# Patient Record
Sex: Female | Born: 1965 | Race: White | Hispanic: No | Marital: Married | Smoking: Never smoker
Health system: Southern US, Community
[De-identification: ages and names within clinical notes are randomized; demographics above are authoritative.]

## PROBLEM LIST (undated history)

## (undated) DIAGNOSIS — IMO0001 Reserved for inherently not codable concepts without codable children: Secondary | ICD-10-CM

## (undated) DIAGNOSIS — E119 Type 2 diabetes mellitus without complications: Principal | ICD-10-CM

## (undated) DIAGNOSIS — F32A Depression, unspecified: Secondary | ICD-10-CM

## (undated) DIAGNOSIS — Z139 Encounter for screening, unspecified: Secondary | ICD-10-CM

## (undated) DIAGNOSIS — Z1231 Encounter for screening mammogram for malignant neoplasm of breast: Secondary | ICD-10-CM

## (undated) DIAGNOSIS — Z9889 Other specified postprocedural states: Secondary | ICD-10-CM

## (undated) DIAGNOSIS — D649 Anemia, unspecified: Secondary | ICD-10-CM

## (undated) DIAGNOSIS — E079 Disorder of thyroid, unspecified: Secondary | ICD-10-CM

## (undated) DIAGNOSIS — F319 Bipolar disorder, unspecified: Secondary | ICD-10-CM

## (undated) DIAGNOSIS — R112 Nausea with vomiting, unspecified: Secondary | ICD-10-CM

## (undated) DIAGNOSIS — T8859XA Other complications of anesthesia, initial encounter: Secondary | ICD-10-CM

## (undated) DIAGNOSIS — T4145XA Adverse effect of unspecified anesthetic, initial encounter: Secondary | ICD-10-CM

## (undated) DIAGNOSIS — M1990 Unspecified osteoarthritis, unspecified site: Secondary | ICD-10-CM

## (undated) DIAGNOSIS — G43909 Migraine, unspecified, not intractable, without status migrainosus: Secondary | ICD-10-CM

## (undated) DIAGNOSIS — F329 Major depressive disorder, single episode, unspecified: Secondary | ICD-10-CM

## (undated) DIAGNOSIS — E7800 Pure hypercholesterolemia, unspecified: Secondary | ICD-10-CM

## (undated) DIAGNOSIS — E559 Vitamin D deficiency, unspecified: Secondary | ICD-10-CM

## (undated) HISTORY — PX: LAPAROSCOPIC CHOLECYSTECTOMY: SUR755

## (undated) HISTORY — PX: TONSILLECTOMY AND ADENOIDECTOMY: SUR1326

## (undated) HISTORY — PX: DILATION AND CURETTAGE OF UTERUS: SHX78

## (undated) HISTORY — PX: CYST EXCISION: SHX5701

## (undated) HISTORY — PX: ANKLE HARDWARE REMOVAL: SHX1149

## (undated) HISTORY — PX: TIBIA FRACTURE SURGERY: SHX806

## (undated) HISTORY — PX: ANKLE FRACTURE SURGERY: SHX122

## (undated) HISTORY — PX: TIBIA HARDWARE REMOVAL: SUR1133

## (undated) HISTORY — PX: BREAST BIOPSY: SHX20

## (undated) HISTORY — PX: FRACTURE SURGERY: SHX138

---

## 1995-12-20 HISTORY — PX: TUBAL LIGATION: SHX77

## 2010-07-23 ENCOUNTER — Encounter

## 2010-07-26 NOTE — Progress Notes (Signed)
Subjective:      Patient ID: Holly Bradford is a 44 y.o. female.    HPI:  Physical for [facility].  Currently not taking any meds.  Discontinued meds 7-8 mos ago.  Not checking sugars.  No polydipsia or polyuria.  Weight stable.  Very busy lately, stress with school and work.  Lost brother-in-law to suicide, son starting "cutting".  No recent labs.    Pt thinks she's peri-menopausal.  Periods still regular.  Very fatigue and having a hard time with sex.  No recent gyn visit for well female.  No vaginal discharge or odor.  No urinary symptoms.    Difficulty with concentration off the Lexapro.  Does not want to go back on.  Afraid of weight gain.    Review of Systems   Constitutional: Negative.  Negative for appetite change.   HENT: Negative.    Respiratory: Negative.    Cardiovascular: Negative.    Gastrointestinal: Negative.    Genitourinary: Positive for vaginal pain. Negative for vaginal bleeding, vaginal discharge and pelvic pain.   Psychiatric/Behavioral: Positive for decreased concentration. Negative for suicidal ideas and sleep disturbance. The patient is not nervous/anxious.        Objective:   Physical Exam   Nursing note and vitals reviewed.  Constitutional: She appears well-developed and well-nourished.   HENT:   Head: Normocephalic.   Mouth/Throat: Oropharynx is clear and moist.   Neck: Normal range of motion. Neck supple.   Cardiovascular: Normal rate, regular rhythm and normal heart sounds.    Pulmonary/Chest: Effort normal and breath sounds normal.   Abdominal: Soft. Bowel sounds are normal.   Neurological: She is alert.   Skin: Skin is warm.   Psychiatric: She has a normal mood and affect.       Assessment:    1.  Well adult   2.  Depression   3.  DM2 - uncontrolled (A1C 9.3)    Patient Active Problem List   Diagnoses   ??? DM type 2 (diabetes mellitus, type 2)   ??? Hyperlipemia   ??? Elev transaminase/LDH   ??? Anxiety   ??? Vitamin D deficiency   ??? Medical non-compliance         Plan:    Rx ActoPlusMet 15/500  mg 1 po bid    Rx Wellbutrin XL 150 mg 1 po daily   Recommend periodic sugar checks   Pt needs well female exam - recommend Scott Stallkamp   Rx vit D 50,000 units q week    Re-check labs in 3 mos    RTO 3 mos.

## 2010-07-27 ENCOUNTER — Encounter

## 2010-07-27 NOTE — Progress Notes (Signed)
Addended by: Altamease Oiler on: 07/27/2010 03:35 PM     Modules accepted: Orders, Medications

## 2010-08-09 ENCOUNTER — Telehealth

## 2010-08-09 NOTE — Telephone Encounter (Signed)
Pt called she started Wellbutrin x 1 week ago.  She is having a lot of bad anxiety feels like joints constricting and very anxious.  Pt wanted to know if you would want to change medication or add something for her anxiety.  Informed pt we would call her back.

## 2010-08-09 NOTE — Telephone Encounter (Signed)
Notify patient to stop Wellbutrin.  Will call in order for Zoloft to her pharmacy.  (was e-prescribed)

## 2010-08-09 NOTE — Telephone Encounter (Signed)
Pt notified of med change from Wellbutrin to Zoloft

## 2010-12-30 ENCOUNTER — Encounter

## 2010-12-30 MED ORDER — VITAMIN D (ERGOCALCIFEROL) 1.25 MG (50000 UT) PO CAPS
1.25 MG (50000 UT) | ORAL_CAPSULE | ORAL | Status: DC
Start: 2010-12-30 — End: 2012-05-06

## 2010-12-30 MED ORDER — PIOGLITAZONE HCL-METFORMIN HCL 15-500 MG PO TABS
15-500 MG | ORAL_TABLET | Freq: Two times a day (BID) | ORAL | Status: DC
Start: 2010-12-30 — End: 2011-07-12

## 2010-12-30 MED ORDER — SERTRALINE HCL 50 MG PO TABS
50 MG | ORAL_TABLET | Freq: Every day | ORAL | Status: DC
Start: 2010-12-30 — End: 2011-07-12

## 2010-12-30 NOTE — Telephone Encounter (Signed)
Ins changed  Pt needs 90-day refill for Sertraline 50mg - 1 daily and ActoplusMet 15/50 takes 1 BID and Vit D 50,000 units takes 1 wkly  Please send scripts to [facility] on [facility]

## 2011-06-27 ENCOUNTER — Telehealth

## 2011-06-27 NOTE — Telephone Encounter (Signed)
Pt has an apt on 06-12-11 and would like to know if lab work needs done before the appt.  If so please mail the lab work to the pt.  Call pt with decision.

## 2011-06-27 NOTE — Telephone Encounter (Signed)
Order mailed to pt

## 2011-07-08 LAB — LIPID PANEL
Chol/HDL Ratio: 3.8
Cholesterol, Total: 241
HDL: 63 mg/dL (ref 35–70)
LDL Calculated: 160 mg/dL (ref 0–160)
Triglycerides: 90 mg/dL
VLDL: 18 mg/dL

## 2011-07-08 LAB — HEMOGLOBIN A1C: Hemoglobin A1C: 5.9 %

## 2011-07-12 MED ORDER — SERTRALINE HCL 50 MG PO TABS
50 MG | ORAL_TABLET | Freq: Every day | ORAL | Status: DC
Start: 2011-07-12 — End: 2012-07-23

## 2011-07-12 MED ORDER — PIOGLITAZONE HCL-METFORMIN HCL 15-500 MG PO TABS
15-500 MG | ORAL_TABLET | Freq: Two times a day (BID) | ORAL | Status: DC
Start: 2011-07-12 — End: 2014-04-01

## 2011-07-12 MED ORDER — ATORVASTATIN CALCIUM 40 MG PO TABS
40 MG | ORAL_TABLET | Freq: Every day | ORAL | Status: DC
Start: 2011-07-12 — End: 2011-08-24

## 2011-07-12 MED ADMIN — methylPREDNISolone acetate (DEPO-MEDROL) injection 80 mg: INTRA_ARTICULAR | @ 12:00:00 | NDC 00009347503

## 2011-07-12 NOTE — Progress Notes (Signed)
Dr Annie Sable gave injection to right knee   Depo Medrol 80 mg/ml along with Marcaine 1ml  Ice applied and pt tolerated well

## 2011-07-12 NOTE — Progress Notes (Signed)
Subjective:      Patient ID: Holly Bradford is a 45 y.o. female.    HPI:  Annual eval.  Overall doing pretty good.  Compliant with meds.  Checks sugars occasionally and they are around 120.    Zoloft seems to be doing ok, still has a little anxiety.    Major concern today is her right knee.  Pain in the knee about every day.  Worse at night.  Ibuprofen seems to help.  No locking or giving out.  Located in the middle of the knee.  Does have some swelling.    Patient Active Problem List   Diagnoses   ??? DM type 2 (diabetes mellitus, type 2)   ??? Hyperlipemia   ??? Elev transaminase/LDH   ??? Anxiety   ??? Vitamin D deficiency   ??? Medical non-compliance         Review of Systems   Constitutional: Negative.    HENT: Negative.    Respiratory: Negative.    Cardiovascular: Negative.    Gastrointestinal: Negative.    Musculoskeletal: Positive for arthralgias (right knee pain) and gait problem.   Psychiatric/Behavioral: The patient is nervous/anxious.    All other systems reviewed and are negative.        Objective:   Physical Exam   Nursing note and vitals reviewed.  Constitutional: She is oriented to person, place, and time. She appears well-developed and well-nourished.   HENT:   Head: Normocephalic and atraumatic.   Right Ear: Tympanic membrane normal.   Left Ear: Tympanic membrane normal.   Mouth/Throat: Oropharynx is clear and moist and mucous membranes are normal.   Neck: Carotid bruit is not present.   Cardiovascular: Normal rate, regular rhythm and normal heart sounds.    No murmur heard.  Pulmonary/Chest: Effort normal and breath sounds normal.   Abdominal: Soft. Bowel sounds are normal.   Musculoskeletal: She exhibits no edema.        Right knee: She exhibits decreased range of motion, effusion and abnormal meniscus. tenderness found. Medial joint line tenderness noted.        Legs:  Neurological: She is alert and oriented to person, place, and time.   Skin: Skin is warm and dry.   Psychiatric: She has a normal mood  and affect. Her behavior is normal.       Assessment:      1. DM type 2 (diabetes mellitus, type 2)  pioglitazone-metformin (ACTOPLUS MET) 15-500 MG per tablet   2. Hyperlipemia  atorvastatin (LIPITOR) 40 MG tablet, Lipid panel, AST, ALT   3. Anxiety  sertraline (ZOLOFT) 50 MG tablet   4. Right knee pain  methylPREDNISolone acetate (DEPO-MEDROL) injection 80 mg, X-ray knee right AP and lateral, PR DRAIN/INJECT LARGE JOINT/BURSA           Plan:      -  Chronic medical problems stable  -  Continue current meds  -  Start Lipitor 40 mg qhs  -  Recheck labs in 3 mos  -  After patient consent obtained, the right infrapatellar region of the knee was cleansed with betadine and anesthetized with ethyl chloride.   The knee was then injected with DepoMedrol 80mg  mixed with 4cc of Marcaine.  Ice then applied for 10 minutes, pt tolerated procedure well  -  Check x-ray right knee, will call with results  -  Call office if no improvement with the shot and will MRI vs PT vs Ortho  -  RTO 3 mos

## 2011-07-12 NOTE — Progress Notes (Signed)
Have you seen any other physician or provider since your last visit no    Have you had any other diagnostic tests since your last visit? no    Have you changed or stopped any medications since your last visit? yes - see updated med list

## 2011-07-12 NOTE — Patient Instructions (Addendum)
Thank you for enrolling in MyChart. Please follow the instructions below to securely access your online medical record. MyChart allows you to send messages to your doctor, view your test results, renew your prescriptions, schedule appointments, and more.     How Do I Sign Up?  1. In your Internet browser, go to [email or web address]  2. Click on the Sign Up Now link in the Sign In box. You will see the New Member Sign Up page.  3. Enter your MyChart Access Code exactly as it appears below. You will not need to use this code after you???ve completed the sign-up process. If you do not sign up before the expiration date, you must request a new code.  MyChart Access Code: [phone]  Expires: 09/10/2011  8:00 AM    4. Enter your Social Security Number ([id number]) and Date of Birth (mm/dd/yyyy) as indicated and click Submit. You will be taken to the next sign-up page.  5. Create a MyChart ID. This will be your MyChart login ID and cannot be changed, so think of one that is secure and easy to remember.  6. Create a MyChart password. You can change your password at any time.  7. Enter your Password Reset Question and Answer. This can be used at a later time if you forget your password.   8. Enter your e-mail address. You will receive e-mail notification when new information is available in MyChart.  9. Click Sign Up. You can now view your medical record.     Additional Information  If you have questions, please contact your physician practice where you receive care. Remember, MyChart is NOT to be used for urgent needs. For medical emergencies, dial 911.          Hyperlipidemia   (Dyslipidemia; High Triglycerides; Triglycerides, High)     Definition   Hyperlipidemia is a high level of fats in the blood. These fats, called lipids, include cholesterol and triglycerides. There are five types of hyperlipidemia. The type depends on which lipid in the blood is high.   Causes   Causes may include:   ?? A family  history of hyperlipidemia   ?? A diet high in total fat, saturated fat, or cholesterol   ?? Obesity   ?? Excess alcohol intake   ?? Certain conditions, including:   ?? Diabetes   ?? Low thyroid   ?? Kidney problems   ?? Liver disease   ?? Cushing's syndrome   ?? Certain drugs, such as:   ?? Hormones or birth control pills   ?? Beta-blockers   ?? Some diuretics   ?? Cortisone drugs   ?? Isotretinoin (for acne )   ?? Some anti- HIV drugs   Risk Factors   These factors increase your chance of developing this condition. Tell your doctor if you have any of these:   ?? Advancing age   ?? Sex: female   ?? Postmenopause   ?? Lack of exercise   ?? Smoking   ?? Stress   ?? Overuse of alcohol   Symptoms   Hyperlipidemia usually does not cause symptoms. Very high levels of lipids or triglycerides can cause:   ?? Fat deposits in the skin or tendons ( xanthomas )   ?? Pain, enlargement, or swelling of organs such as the liver, spleen, or pancreas ( pancreatitis )   ?? Obstruction of blood vessels in heart and brain   Hyperlipidemia can increase your risk of atherosclerosis . This is a dangerous hardening of  the arteries. It can end up blocking blood flow. In some cases, this may result in:   ?? Angina   ?? Heart attack   ?? Stroke   ?? Other serious complications   Blood Vessel with Atherosclerosis        2011 [employer]   Diagnosis   This condition is diagnosed with blood tests. These tests measure the levels of lipids in the blood. The National Cholesterol Education Program advises that you have your lipids checked at least once every five years, starting at age 54. Also, the American Academy of Pediatrics recommends lipid screening for children at risk (eg, a family history of hyperlipidemia).   Testing may consist of a fasting blood test for:   ?? Total cholesterol   ?? LDL (bad cholesterol)   ?? HDL (good cholesterol)   ?? Triglycerides   Your doctor may recommend more frequent or earlier testing if you have:   ?? Family history of  hyperlipidemia   ?? Risk factor or disease that may cause hyperlipidemia   ?? Complication that may result from hyperlipidemia   Treatment   Treatment is not only aimed at correcting your cholesterol levels, but also at lowering your overall risk for heart disease and strokes.   Diet Changes   ?? Eat a diet low in total fat, saturated fat, and cholesterol .   ?? Reduce or eliminate the amount of alcohol you drink.   ?? Eat more high-fiber foods.   Lifestyle Changes   ?? If you are overweight, lose weight .   ?? If you smoke, quit .   ?? Exercise regularly . Talk to you doctor before starting an exercise program. You may already have hardening of the arteries or heart disease. These conditions increase your risk of having a heart attack while exercising.   ?? Make sure other medical conditions such as high blood pressure and diabetes are being treated and controlled.   Medications   There are a number of drugs available, such as statins , to treat this condition and help lower your risk for heart disease. Talk to your doctor. Statins have been shown to reduce mortality (death), heart attacks , and stroke.   These medicines are best used as additions to diet and exercise and should not replace healthy lifestyle changes.   Prevention   To help reduce your chance of getting hyperlipidemia, take the following steps:   ?? Starting at age 65, get cholesterol tests.   ?? Eat a diet low in total fat, saturated fat, and cholesterol.   ?? If you smoke, quit.   ?? Drink alcohol in moderation (two drinks per day for men, one drink per day for women).   ?? If you are overweight, lose weight.   ?? Exercise regularly. Talk with your doctor first.   ?? If you have diabetes , control your blood sugar.   ?? Talk to your doctor about medications you are taking. They may have side effects that cause hyperlipidemia.     Last Reviewed: September 2010 Caro Laroche, DO   Updated: 10/27/2009 Type 2 Diabetes   (Diabetes Mellitus Type 2; Insulin-Resistant  Diabetes; Diabetes, Type 2)     Definition   Glucose comes from the breakdown of food. It is the body's energy source. It can be absorbed from the blood into the cells with the help of a hormone called insulin. Without insulin, glucose will build up in the blood and cause hyperglycemia. At the  same time, your body's cells are starved for glucose (energy).   A lack of insulin or resistance to insulin causes diabetes. In type 2 diabetes, the body is resistant to high levels of insulin. There is plenty of insulin in the body, but the cells are unable to use it.   High blood sugar levels over a long period of time can damage vital organs. This can include the kidneys, eyes, and nerves.     The Pancreas        2011 [employer]   Causes   Two conditions contribute to hyperglycemia in type 2 diabetes:   ?? Insulin resistance related to excess body fat   ?? Body's failure to make an adequate amount of insulin   Risk Factors   Factors that increase your chance for type 2 diabetes include:   ?? Having a family history of type 2 diabetes   ?? Being obese or overweight (especially excess weight in the upper body and abdomen)   ?? Eating a lot of meat, especially processed meat (eg, processed luncheon meats, hot dogs, sausages)   ?? Having cholesterol problems (low HDL "good" cholesterol and high triglycerides )   ?? Having high blood pressure   ?? Having a history of cardiovascular disease   ?? Having depression   ?? Having a history of gestational diabetes or having a baby that weighs over nine pounds   ?? Having an endocrine disorder ( Cushing's syndrome , hyperthyroidism , acromegaly , polycystic ovary syndrome , pheochromocytoma, glucagonoma)   ?? Having a condition associated with insulin resistance (eg, acanthosis nigricans )   ?? Having previous blood test results that show impaired glucose tolerance and impaired fasting glucose   ?? Taking certain medicines (eg, pentamidine, nicotinic acid, glucocorticoids, thiazide)    ?? Having a sedentary lifestyle   ?? Having sleep difficulties   ?? Having a low birth weight   ?? Gender: more common in older women than men   ?? Race: [place] American, Hispanic, Native American, Hispanic American, [place] American, or Pacific Islander   ?? Age: 42 years or older and younger people who are obese and belong to at risk ethnic groups   Symptoms   You may not have symptoms for years. Symptoms due to high blood sugar or diabetic complications may include:   ?? Increased urination   ?? Extreme thirst   ?? Hunger   ?? Fatigue   ?? Blurry vision   ?? Irritability   ?? Frequent or recurring infections   ?? Poor wound healing   ?? Angina   ?? Painful leg cramps when walking   ?? Numbness or tingling in the hands or feet   ?? In women: frequent vaginal yeast infections and urinary tract infections   ?? Problems with gums   ?? Itching   ?? Impotence   Diagnosis   The doctor will ask about your symptoms and medical history. You will also be asked about your family history. A physical exam will be done.   Diagnosis is based on the results of blood testing. These guidelines are from the American Diabetes Association (ADA):   ?? Symptoms of diabetes and a random blood test revealing a blood sugar level greater than or equal to 200 mg/dL [16.1 mmol/L]   ?? Blood sugar tests after you have not eaten for eight or more hours (called fasting blood sugar ) revealing blood sugar levels greater than or equal to 126 mg/dL (7.0 mmol/L) on  two different days   ?? Glucose tolerance test measuring blood sugar two hours after you consume glucose with a measurement greater than or equal to 200 mg/dL (09.8 mmol/L)   ?? HbA1c level of 6.5% or higher, indicating poor blood sugar control over the past 2-4 months   mg/dL=milligrams per deciliter of blood; mmol/L=millimole per liter of blood   Treatment   Treatment aims to:   ?? Maintain blood sugar at levels as close to normal as possible   ?? Preventing or delaying complications (regular medical care is  important for this)   ?? Control other conditions that you may have, like high blood pressure and high cholesterol   Diet   ?? Follow a balanced meal plan. Eat consistent and moderate amounts of food at regular times.   1. Nuts and peanut butter are a good choice for a snack. In women with diabetes, these snacks may help reduce the risk of cardiovascular disease.   ?? Do not skip meals.   ?? Eat plenty of vegetables and fiber .   ?? Eat limited amounts of fat .   ?? Eat moderate amounts of protein and low-fat dairy products .   ?? Carefully limit foods containing high concentrated sugar .   ?? Keep a record of your food intake. This will help a dietitian or doctor advise you.   Weight Loss   If you are overweight, talk to your doctor about a reasonable weight goal. Weight loss will help your body respond better to insulin.You and your doctor or nutritionist can develop a safe diet program for you.   There are options that may help you lose weight:   ?? Using a portion control plate BE   ?? Using a prepared meal plan   ?? Eating a Mediterranean-style diet   Group education may help people recently diagnosed with their goals in weight loss.   Exercise   Physical activity:   ?? Can make the body become more sensitive to insulin   ?? Will help you reach and maintain a healthy weight   ?? Can lower the levels of fat in your blood   ?? Has been found to improve blood sugar control Aerobic exercise and strength training can help to improve HbA1c levels. Researchers have also found that long-term strength and endurance training may improve HbAIc, even in the absence of weight loss.   Talk to your doctor about any restrictions. Work with your doctor to make an activity plan. Even a brief counseling session may help to increase your activity levels.   Diabetes is a risk factor for heart disease. Exercising can help to reduce your risk for heart disease.   Oral Medication   Medicines taken by mouth may be used to lower blood sugar:    ?? Metformin: a class of drug that reduces the body's production of glucose. It also makes the body more sensitive to insulin. This combination will help keep blood sugar levels within the normal limits.   ?? Drugs that prompt the cells in the pancreas to make more insulin (eg, sulfonylureas [glyburide , tolazamide], dipeptidyl peptidase-4 inhibitors [ saxagliptin, sitagliptin], repaglinide [Prandin])   1. The FDA has warned that sitagliptin may increase the risk of acute pancreatitis.   ?? Insulin sensitizersa class of drugs that help the body better use insulin (eg, pioglitazone)   ?? Starch blockersa class of drugs (eg, acarbose , miglitol) that lessen glucose absorption into the bloodstream   Injectable medicine, such as:   ??  Incretin-mimetics (eg, exenatide) stimulate the pancreas to produce insulin and suppress appetite often leading to weight loss (twice daily injections).   ?? Amylin analogues (eg, pramlintide) replace a protein that is normally produced by the pancreas and is low in people with type 2 diabetes (injection before each meal).   Talk to your doctor about your drug program.   Insulin   In some cases the body does not make enough insulin. Insulin injections may be needed.   This is needed when blood sugar levels are not kept low enough with lifestyle change and medicine.   Blood Sugar Testing   Checking blood sugar levels during the day can help you stay on track. It will also helps your doctor determine if your treatment is working. Testing is easy with a monitor. Keeping track of blood sugar levels is especially important if you take insulin. Frequency of testing is determined by how well your blood sugar control is doing.   The HbA1c may also be done at your doctor's office. Doctors advise that most keep their HbA1c levels below 7% (ADA recommendation). This level has been shown to lead to fewer diabetic complications.   Regular blood sugar testing may not be needed in patients with type 2  diabetes. It may not be needed for those whose condition is under reasonably good control without insulin. Talk with your doctor before stopping blood sugar monitoring.   Alternative Therapies   One study focused on people with a specific type 2 diabetes. When given vitamin E, they showed a decrease in the rates of heart problems.   Prevention   Lifestyle changes seem to be most effective. To reduce your chances of developing type 2 diabetes:   ?? Participate in regular physical activity.   ?? Maintain a healthy weight.   ?? Drink alcohol in moderation (two drinks per day for a man, and one drink per day for a woman)   ?? Eat a well-balanced diet:   1. Get enough fiber   2. Avoid fatty foods   3. Limit sugar intake   4. Eat more green, leafy vegetables   BE = This therapy has the best evidence available showing that it is effective.     Last Reviewed: September 2010 Bea Laura, MD, [place]   Updated: 02/25/2010 Hyperlipidemia   (Dyslipidemia; High Triglycerides; Triglycerides, High)     Definition   Hyperlipidemia is a high level of fats in the blood. These fats, called lipids, include cholesterol and triglycerides. There are five types of hyperlipidemia. The type depends on which lipid in the blood is high.   Causes   Causes may include:   ?? A family history of hyperlipidemia   ?? A diet high in total fat, saturated fat, or cholesterol   ?? Obesity   ?? Excess alcohol intake   ?? Certain conditions, including:   ?? Diabetes   ?? Low thyroid   ?? Kidney problems   ?? Liver disease   ?? Cushing's syndrome   ?? Certain drugs, such as:   ?? Hormones or birth control pills   ?? Beta-blockers   ?? Some diuretics   ?? Cortisone drugs   ?? Isotretinoin (for acne )   ?? Some anti- HIV drugs   Risk Factors   These factors increase your chance of developing this condition. Tell your doctor if you have any of these:   ?? Advancing age   ?? Sex: female   ?? Postmenopause   ?? Lack of  exercise   ?? Smoking   ?? Stress   ?? Overuse of alcohol   Symptoms    Hyperlipidemia usually does not cause symptoms. Very high levels of lipids or triglycerides can cause:   ?? Fat deposits in the skin or tendons ( xanthomas )   ?? Pain, enlargement, or swelling of organs such as the liver, spleen, or pancreas ( pancreatitis )   ?? Obstruction of blood vessels in heart and brain   Hyperlipidemia can increase your risk of atherosclerosis . This is a dangerous hardening of the arteries. It can end up blocking blood flow. In some cases, this may result in:   ?? Angina   ?? Heart attack   ?? Stroke   ?? Other serious complications   Blood Vessel with Atherosclerosis        2011 [employer]   Diagnosis   This condition is diagnosed with blood tests. These tests measure the levels of lipids in the blood. The National Cholesterol Education Program advises that you have your lipids checked at least once every five years, starting at age 37. Also, the American Academy of Pediatrics recommends lipid screening for children at risk (eg, a family history of hyperlipidemia).   Testing may consist of a fasting blood test for:   ?? Total cholesterol   ?? LDL (bad cholesterol)   ?? HDL (good cholesterol)   ?? Triglycerides   Your doctor may recommend more frequent or earlier testing if you have:   ?? Family history of hyperlipidemia   ?? Risk factor or disease that may cause hyperlipidemia   ?? Complication that may result from hyperlipidemia   Treatment   Treatment is not only aimed at correcting your cholesterol levels, but also at lowering your overall risk for heart disease and strokes.   Diet Changes   ?? Eat a diet low in total fat, saturated fat, and cholesterol .   ?? Reduce or eliminate the amount of alcohol you drink.   ?? Eat more high-fiber foods.   Lifestyle Changes   ?? If you are overweight, lose weight .   ?? If you smoke, quit .   ?? Exercise regularly . Talk to you doctor before starting an exercise program. You may already have hardening of the arteries or heart disease. These  conditions increase your risk of having a heart attack while exercising.   ?? Make sure other medical conditions such as high blood pressure and diabetes are being treated and controlled.   Medications   There are a number of drugs available, such as statins , to treat this condition and help lower your risk for heart disease. Talk to your doctor. Statins have been shown to reduce mortality (death), heart attacks , and stroke.   These medicines are best used as additions to diet and exercise and should not replace healthy lifestyle changes.   Prevention   To help reduce your chance of getting hyperlipidemia, take the following steps:   ?? Starting at age 61, get cholesterol tests.   ?? Eat a diet low in total fat, saturated fat, and cholesterol.   ?? If you smoke, quit.   ?? Drink alcohol in moderation (two drinks per day for men, one drink per day for women).   ?? If you are overweight, lose weight.   ?? Exercise regularly. Talk with your doctor first.   ?? If you have diabetes , control your blood sugar.   ?? Talk to your doctor  about medications you are taking. They may have side effects that cause hyperlipidemia.     Last Reviewed: September 2010 Caro Laroche, DO   Updated: 10/27/2009

## 2011-08-24 MED ORDER — SIMVASTATIN 40 MG PO TABS
40 MG | ORAL_TABLET | Freq: Every evening | ORAL | Status: DC
Start: 2011-08-24 — End: 2012-07-23

## 2011-08-24 NOTE — Telephone Encounter (Signed)
Holly Bradford was notified to stop the Atorvastatin and to take simvastatin that was sent to her pharmacy.

## 2011-08-24 NOTE — Telephone Encounter (Signed)
Holly Bradford states she has horrible problems with constipation while taking Atorvastatin.  She stopped it for a couple weeks and the constipation left.  After restarting it for a week now she is really struggling even though she is taking a laxative/stool softener and is drinking 10 glasses of water.  What do you suggest?

## 2011-09-07 NOTE — Progress Notes (Signed)
I have reviewed the provider's instructions with the patient, answering all questions to her satisfaction.

## 2011-09-07 NOTE — Progress Notes (Signed)
Pt set up with Dr. Coral Else at [facility] on Sept 21, 2012 @ 7:50 am. Pt instructed to bring insurance card, photo id, and medications.  All info faxed to the office.  Spoke with Jasmine December at [facility] she will have Xray disc ready for pt to pick up tomorrow to take to [facility].

## 2011-09-07 NOTE — Progress Notes (Signed)
Subjective:      Patient ID: Holly Bradford is a 45 y.o. female.    HPI:  Pt c/o persistent right knee pain.    Review of Systems   Constitutional: Negative.    HENT: Negative.    Respiratory: Negative.    Cardiovascular: Negative.    Gastrointestinal: Negative.    Musculoskeletal: Positive for arthralgias (right knee pain and swelling).   All other systems reviewed and are negative.        Objective:   Physical Exam   Nursing note and vitals reviewed.  Constitutional: She is oriented to person, place, and time. She appears well-developed and well-nourished.   HENT:   Right Ear: Tympanic membrane normal.   Left Ear: Tympanic membrane normal.   Mouth/Throat: Mucous membranes are normal.   Neck: Carotid bruit is not present.   Cardiovascular: Normal rate, regular rhythm and normal heart sounds.    No murmur heard.  Pulmonary/Chest: Effort normal and breath sounds normal.   Musculoskeletal: She exhibits no edema.        Right knee: She exhibits decreased range of motion, swelling, effusion and abnormal meniscus. tenderness found. Medial joint line tenderness noted.   Neurological: She is alert and oriented to person, place, and time.   Skin: Skin is warm and dry.   Psychiatric: She has a normal mood and affect. Her behavior is normal.       Assessment:      1. Right knee pain  Ambulatory referral to Orthopedic Surgery   2. Baker's cyst of knee  Ambulatory referral to Orthopedic Surgery   3. Knee osteoarthritis             Plan:      -  X-ray reviewed with pt, she appears to have pretty severe OA in medial compartment  -  Will refer to [facility] to see if she is a candidate for arthroscopy vs TKA vs Synvisc  -  RTO prn

## 2011-10-13 LAB — LIPID PANEL
Cholesterol, Total: 197
HDL: 64 mg/dL (ref 35–70)
LDL Calculated: 119 mg/dL (ref 0–160)
Triglycerides: 71 mg/dL
VLDL: 14 mg/dL

## 2012-01-05 MED ORDER — ACTOPLUS MET 15-500 MG PO TABS
15-500 MG | ORAL_TABLET | ORAL | Status: DC
Start: 2012-01-05 — End: 2013-01-24

## 2012-05-07 MED ORDER — VITAMIN D (ERGOCALCIFEROL) 1.25 MG (50000 UT) PO CAPS
1.25 MG (50000 UT) | ORAL_CAPSULE | ORAL | Status: DC
Start: 2012-05-07 — End: 2014-07-30

## 2012-07-03 ENCOUNTER — Telehealth

## 2012-07-03 NOTE — Telephone Encounter (Signed)
Pt called and says she has a physical with you on Aug 5th. She would like some labs done before she comes in. Pt advised to get them done 2 weeks before her appt. Pt will pick them up on thursday

## 2012-07-04 NOTE — Telephone Encounter (Signed)
Order at front window

## 2012-07-13 LAB — COMPREHENSIVE METABOLIC PANEL

## 2012-07-13 LAB — VITAMIN D 25 HYDROXY

## 2012-07-13 LAB — LIPID PANEL
Chol/HDL Ratio: 3.5
Cholesterol, Total: 224
HDL: 64 mg/dL (ref 35–70)
LDL Calculated: 144 mg/dL (ref 0–160)
Triglycerides: 79 mg/dL
VLDL: 16 mg/dL

## 2012-07-13 LAB — HEMOGLOBIN A1C: Hemoglobin A1C: 5.9 %

## 2012-07-16 NOTE — Telephone Encounter (Signed)
Confirmed upcoming appointment and reminded patient to get lab work done.

## 2012-07-23 MED ORDER — PRAVASTATIN SODIUM 40 MG PO TABS
40 MG | ORAL_TABLET | Freq: Every day | ORAL | Status: DC
Start: 2012-07-23 — End: 2012-10-02

## 2012-07-23 MED ORDER — SERTRALINE HCL 50 MG PO TABS
50 MG | ORAL_TABLET | Freq: Every day | ORAL | Status: DC
Start: 2012-07-23 — End: 2014-07-30

## 2012-07-23 NOTE — Patient Instructions (Signed)
I have reviewed the provider's instructions with the patient, answering all questions to her satisfaction.

## 2012-07-23 NOTE — Progress Notes (Signed)
Subjective:      Patient ID: Holly Bradford is a 46 y.o. female.    HPI:  Annual eval.  Doing well overall.      Still struggling with right knee.  Was trying to get Synvisc approved.  Works at [employer], stands the whole time.  Trying to lose wt and find a new job.    Wt Readings from Last 3 Encounters:   07/23/12 299 lb 12.8 oz (135.988 kg)   09/07/11 310 lb (140.615 kg)   07/12/11 302 lb 6.4 oz (137.168 kg)     Not taking cholesterol med, messes up her bowels.      Sugars are doing fine, not checking on regular basis.    Patient Active Problem List   Diagnosis   ??? DM type 2 (diabetes mellitus, type 2)   ??? Hyperlipemia   ??? Elev transaminase/LDH   ??? Anxiety   ??? Vitamin D deficiency   ??? Medical non-compliance           Review of Systems   Constitutional: Negative.    HENT: Negative.    Respiratory: Negative.    Cardiovascular: Negative.    Gastrointestinal: Negative.    Musculoskeletal: Positive for arthralgias and gait problem.   All other systems reviewed and are negative.        Objective:   Physical Exam   Nursing note and vitals reviewed.  Constitutional: She is oriented to person, place, and time. She appears well-developed and well-nourished.   HENT:   Head: Normocephalic and atraumatic.       Right Ear: Tympanic membrane normal.   Left Ear: Tympanic membrane normal.   Mouth/Throat: Oropharynx is clear and moist and mucous membranes are normal.   Neck: Carotid bruit is not present.   Cardiovascular: Normal rate, regular rhythm and normal heart sounds.    No murmur heard.  Pulmonary/Chest: Effort normal and breath sounds normal.   Abdominal: Soft. Bowel sounds are normal.   Musculoskeletal: She exhibits no edema.   Neurological: She is alert and oriented to person, place, and time.   Skin: Skin is warm and dry.   Psychiatric: She has a normal mood and affect. Her behavior is normal.       Assessment:      1. DM type 2 (diabetes mellitus, type 2)  pravastatin (PRAVACHOL) 40 MG tablet   2. Hyperlipemia   Lipid panel, AST, ALT   3. Anxiety  sertraline (ZOLOFT) 50 MG tablet   4. Vitamin D deficiency     5. Change in facial mole  Ambulatory referral to Plastic Surgery            Plan:      -  Chronic medical problems stable  -  Continue current medications  -  Start Pravastatin  -  Refer to Dr. Marcelino Duster  -  Recheck labs 3 mos  -  RTO 3 mos    Holly Bradford received counseling on the following healthy behaviors: nutrition and exercise    Patient given educational materials on Diabetes    I have instructed Holly Bradford to complete a self tracking handout on Blood Sugars  and instructed them to bring it with them to her next appointment.     Discussed use, benefit, and side effects of prescribed medications.  Barriers to medication compliance addressed.  All patient questions answered.  Pt voiced understanding.

## 2012-07-25 NOTE — Progress Notes (Signed)
Appt with Dr. Marcelino Duster 08-09-12 @ 1100, pt advised of appt time

## 2012-08-01 MED ORDER — SERTRALINE HCL 50 MG PO TABS
50 MG | ORAL_TABLET | ORAL | Status: DC
Start: 2012-08-01 — End: 2012-10-02

## 2012-08-01 MED ORDER — ATORVASTATIN CALCIUM 40 MG PO TABS
40 MG | ORAL_TABLET | ORAL | Status: DC
Start: 2012-08-01 — End: 2013-06-28

## 2012-10-02 NOTE — Progress Notes (Signed)
Subjective:      Patient ID: Holly Bradford is a 46 y.o. female.    HPI:  Pt was painting yesterday and fell off a stool.  Twisted right knee and right ankle.  Hit head but no LOC.  No neck pain.      Right knee hurts in the area of the kneecap.  Hurts to bend the knee.  Feels a little unsteady at times.    Pt has appt with me in November, lost her lab order.    Review of Systems   Constitutional: Negative.    HENT: Negative.    Respiratory: Negative.    Cardiovascular: Negative.    Gastrointestinal: Negative.    Musculoskeletal: Positive for joint swelling, arthralgias (right knee pain) and gait problem.   All other systems reviewed and are negative.        Objective:   Physical Exam   Nursing note and vitals reviewed.  Constitutional: She is oriented to person, place, and time. She appears well-developed and well-nourished.   HENT:   Head: Normocephalic and atraumatic.   Right Ear: Tympanic membrane normal.   Left Ear: Tympanic membrane normal.   Mouth/Throat: Oropharynx is clear and moist and mucous membranes are normal.   Neck: Carotid bruit is not present.   Cardiovascular: Normal rate, regular rhythm and normal heart sounds.    No murmur heard.  Pulmonary/Chest: Effort normal and breath sounds normal.   Abdominal: Soft. Bowel sounds are normal.   Musculoskeletal: She exhibits no edema.        Right knee: She exhibits decreased range of motion, effusion and abnormal meniscus. Tenderness found. Medial joint line and lateral joint line tenderness noted.   Neurological: She is alert and oriented to person, place, and time.   Skin: Skin is warm and dry.   Psychiatric: She has a normal mood and affect. Her behavior is normal.       Assessment:      1. Right knee pain     2. Hyperlipemia  Lipid panel, AST, ALT            Plan:      -  Would like to give pt a little more time to see if she can heal on her own  -  Recommend Aleve and ice  -  RTO prn

## 2012-11-22 LAB — LIPID PANEL
Chol/HDL Ratio: 2.7
Cholesterol, Total: 189
HDL: 70 mg/dL (ref 35–70)
LDL Calculated: 107 mg/dL (ref 0–160)
Triglycerides: 62 mg/dL
VLDL: 12 mg/dL

## 2012-11-26 MED ORDER — NYSTATIN-TRIAMCINOLONE 100000-0.1 UNIT/GM-% EX CREA
CUTANEOUS | Status: DC
Start: 2012-11-26 — End: 2014-07-30

## 2012-11-26 NOTE — Progress Notes (Signed)
Subjective:      Patient ID: Holly Bradford is a 46 y.o. female.    HPI:  3 month eval.    Here to review cholesterol labs.  Tolerating the Lipitor.  Having some problems with bladder control, wonders if related.    Has a concern today about some rashes in her groin area.  Believes yeast.  Has tried Nystatin in the past which worked well.    Sugars have been doing fine.  Last A1C 5.9.  Up 11 lbs.    Wt Readings from Last 3 Encounters:   11/26/12 310 lb (140.615 kg)   10/02/12 310 lb (140.615 kg)   07/23/12 299 lb 12.8 oz (135.988 kg)     Pt also has been losing her urine lately.  No urgency or burning.  Just when coughs, sneezes, etc.    Patient Active Problem List   Diagnosis   ??? DM type 2 (diabetes mellitus, type 2)   ??? Hyperlipemia   ??? Elev transaminase/LDH   ??? Anxiety   ??? Vitamin D deficiency   ??? Medical non-compliance         Review of Systems   Constitutional: Negative.    HENT: Negative.    Respiratory: Negative.    Cardiovascular: Negative.    Gastrointestinal: Negative.    Genitourinary:        Stress incontinence   Musculoskeletal: Negative.    All other systems reviewed and are negative.        Objective:   Physical Exam   Nursing note and vitals reviewed.  Constitutional: She is oriented to person, place, and time. She appears well-developed and well-nourished.   HENT:   Head: Normocephalic and atraumatic.   Right Ear: Tympanic membrane normal.   Left Ear: Tympanic membrane normal.   Mouth/Throat: Oropharynx is clear and moist and mucous membranes are normal.   Neck: Carotid bruit is not present.   Cardiovascular: Normal rate, regular rhythm and normal heart sounds.    No murmur heard.  Pulmonary/Chest: Effort normal and breath sounds normal.   Abdominal: Soft. Bowel sounds are normal.   Musculoskeletal: She exhibits no edema.   Neurological: She is alert and oriented to person, place, and time.   Skin: Skin is warm and dry.   Psychiatric: She has a normal mood and affect. Her behavior is normal.        Assessment:      1. DM type 2 (diabetes mellitus, type 2)  Hemoglobin A1c, Comprehensive metabolic panel   2. Hyperlipemia  Lipid panel, Comprehensive metabolic panel   3. Candidal intertrigo  nystatin-triamcinolone (MYCOLOG II) cream   4. Stress incontinence     5. Morbid obesity              Plan:      -  Chronic medical problems stable  -  Continue current medications  -  Labs reviewed with pt, good response to Lipitor  -  rx Mycolog given  -  Pt declined Urology referral for #4  -  Encouraged wt loss  -  Recheck labs 6 mos  -  RTO 6 mos    Holly Bradford received counseling on the following healthy behaviors: nutrition, exercise and medication adherence    Patient given educational materials on Diabetes    I have instructed Holly Bradford to complete a self tracking handout on Blood Sugars  and instructed them to bring it with them to her next appointment.     Discussed use, benefit, and  side effects of prescribed medications.  Barriers to medication compliance addressed.  All patient questions answered.  Pt voiced understanding.

## 2012-11-26 NOTE — Progress Notes (Signed)
Have you seen any other physician or provider since your last visit no    Have you had any other diagnostic tests since your last visit? yes - labs    Have you changed or stopped any medications since your last visit including any over-the-counter medicines, vitamins, or herbal medicines? no     Are you taking all your prescribed medications? Yes    If NO, why?               BP Readings from Last 2 Encounters:   11/26/12 124/84   10/02/12 132/78     Hemoglobin A1C (%)   Date Value   07/13/2012 5.9         LDL Calculated (mg/dL)   Date Value   16/12/958 107               Tobacco use:  Patient  reports that she has never smoked. She has never used smokeless tobacco.    If Smoker - Cessation materials given? NA    Is Daily aspirin on medication list?:  No    Diabetic retinal exam done? No   If yes, document in Health Maintenance.     Monofilament placed on counter? Yes    Shoes and socks removed? Yes    BP taken with correct size cuff? Yes   Repeated if > 130/80 NA     Is patient taking any medications for diabetes    Yes   If yes, see medication list    Is the patient reporting any side effects of diabetic medications?   No    Microalbumin performed if applicable?  No      Is patient taking any over the counter medications    Yes   If yes, see medication list

## 2012-11-26 NOTE — Patient Instructions (Addendum)
Type 2 Diabetes: After Your Visit  Your Care Instructions  Type 2 diabetes is a lifelong disease that develops when the pancreas cannot make enough insulin or when the body's tissues cannot use insulin properly. Insulin is a hormone that helps the body???s cells use sugar (glucose) for energy. It also helps the body store extra sugar in muscle, fat, and liver cells.  Without insulin, the sugar cannot get into the cells to do its work. It stays in the blood instead. This can cause high blood sugar levels. A person has diabetes when the blood sugar stays too high too much of the time. Over time, diabetes can lead to diseases of the heart, blood vessels, nerves, kidneys, and eyes.  You may be able to control your blood sugar by losing weight, eating a healthy diet, and getting daily exercise. You may also have to take pills or insulin shots (or both).  Follow-up care is a key part of your treatment and safety. Be sure to make and go to all appointments, and call your doctor if you are having problems. It???s also a good idea to know your test results and keep a list of the medicines you take.  How can you care for yourself at home?  ?? Keep your blood sugar at a target level (which you set with your doctor).  ?? Eat a good diet that spreads carbohydrate throughout the day. Carbohydrate--the body's main source of fuel--affects blood sugar more than any other nutrient. Carbohydrate is in fruits, vegetables, milk, and yogurt. It also is in breads, cereals, vegetables such as potatoes and corn, and sugary foods such as candy and cakes.  ?? Aim for 30 minutes of exercise on most, preferably all, days of the week. Walking is a good choice. You also may want to do other activities, such as running, swimming, cycling, or playing tennis or team sports.  ?? Take your medicines exactly as prescribed. Call your doctor if you think you are having a problem with your medicine. You will get more details on the specific medicines your  doctor prescribes.  ?? Check your blood sugar as often as your doctor recommends. It is important to keep track of any symptoms you have, such as low blood sugar, and any changes in your activities, diet, or insulin use.  ?? Take a daily low-dose aspirin if your doctor suggests it. Aspirin may lower your risk of heart attack.  ?? Do not smoke. If you need help quitting, talk to your doctor about stop-smoking programs and medicines. These can increase your chances of quitting for good.  ?? Keep your cholesterol and blood pressure at normal levels. You may need to take one or more medicines to reach your goals. Take them exactly as directed. Do not stop or change a medicine without talking to your doctor first.  When should you call for help?  Call 911 anytime you think you may need emergency care. For example, call if:  ?? You passed out (lost consciousness), or you suddenly become very sleepy or confused. (You may have very low blood sugar.)  Call your doctor now or seek immediate medical care if:  ?? Your blood sugar is 300 mg/dL or is higher than the level your doctor has set for you.  ?? You have symptoms of low blood sugar, such as:  ?? Sweating.  ?? Feeling nervous, shaky, and weak.  ?? Extreme hunger and slight nausea.  ?? Dizziness and headache.  ?? Blurred vision.  ??   Confusion.  Watch closely for changes in your health, and be sure to contact your doctor if:  ?? You often have problems controlling your blood sugar.  ?? You have symptoms of long-term diabetes problems, such as:  ?? New vision changes.  ?? New pain, numbness, or tingling in your hands or feet.  ?? Skin problems.    Where can you learn more?    Go to [email or web address] and sign in to your MyChart account. Enter C553 in the Search Health Information box to learn more about ???Type 2 Diabetes: After Your Visit.???    If you do not have an account, please click on the ???Sign Up Now??? link.      ?? 2006-2013 [employer]. Care instructions  adapted under license by [facility] ([facility] This care instruction is for use with your licensed healthcare professional. If you have questions about a medical condition or this instruction, always ask your healthcare professional. [employer] disclaims any warranty or liability for your use of this information.  Content Version: 9.7.130178; Last Revised: July 06, 2010    Patient Self-Management Goal for Chronic Condition  Goal: I will take all medications as prescribed by my doctor, and I will call the office if I am having any medication problems.  Barriers to success: none  Plan for overcoming my barriers: N/A     Confidence: 10/10  Date goal set: 11/26/2012  Date goal attained:     I have given patient the [facility] paperwork to review.  I have reviewed the provider's instructions with the patient, answering all questions to her satisfaction.

## 2012-12-19 HISTORY — PX: MOLE REMOVAL: SHX2046

## 2013-01-24 MED ORDER — PIOGLITAZONE HCL-METFORMIN HCL 15-500 MG PO TABS
15-500 MG | ORAL_TABLET | ORAL | Status: DC
Start: 2013-01-24 — End: 2014-07-30

## 2013-03-11 MED ORDER — DOXYCYCLINE HYCLATE 100 MG PO CAPS
100 MG | ORAL_CAPSULE | Freq: Two times a day (BID) | ORAL | Status: AC
Start: 2013-03-11 — End: 2013-03-21

## 2013-03-11 NOTE — Progress Notes (Signed)
Subjective:      Patient ID: Holly Bradford is a 47 y.o. female.    HPI:  Pt c/o cough, congestion, sinus pressure for the last 10 days.  No fevers.  Started out with "achiness" and tired.  Non-productive cough.  OTC's help some.      Review of Systems   Constitutional: Positive for chills and fatigue. Negative for fever.   HENT: Positive for congestion, sore throat, rhinorrhea and sinus pressure.    Respiratory: Positive for cough. Negative for shortness of breath and wheezing.    Cardiovascular: Negative.    Gastrointestinal: Negative.    Musculoskeletal: Positive for myalgias.   All other systems reviewed and are negative.        Objective:   Physical Exam   Nursing note and vitals reviewed.  Constitutional: She is oriented to person, place, and time. She appears well-developed and well-nourished.   HENT:   Head: Normocephalic and atraumatic.   Right Ear: A middle ear effusion is present.   Left Ear: A middle ear effusion is present.   Nose: Mucosal edema and rhinorrhea present. Right sinus exhibits maxillary sinus tenderness. Left sinus exhibits maxillary sinus tenderness.   Mouth/Throat: Mucous membranes are normal. Posterior oropharyngeal erythema present. No oropharyngeal exudate.   Cardiovascular: Normal rate, regular rhythm and normal heart sounds.    No murmur heard.  Pulmonary/Chest: Effort normal. She has no decreased breath sounds. She has no wheezes. She has rhonchi in the right lower field and the left lower field.   Abdominal: Soft. Bowel sounds are normal.   Musculoskeletal: She exhibits no edema.   Neurological: She is alert and oriented to person, place, and time.   Skin: Skin is warm and dry.   Psychiatric: She has a normal mood and affect. Her behavior is normal.       Assessment:      1. Sinusitis  doxycycline (VIBRAMYCIN) 100 MG capsule   2. Bronchitis              Plan:      -  rx Doxy given, pt still sounds viral with the myalgias and fatigue but illness greater than 10 days   -   Continue OTC treatment  -  RTO if worsening symptoms

## 2013-03-11 NOTE — Progress Notes (Signed)
Have you seen any other physician or provider since your last visit no    Have you had any other diagnostic tests since your last visit? no    Have you changed or stopped any medications since your last visit including any over-the-counter medicines, vitamins, or herbal medicines? no     Are you taking all your prescribed medications? Yes    If NO, why?

## 2013-03-11 NOTE — Patient Instructions (Signed)
I have reviewed the provider's instructions with the patient, answering all questions to her satisfaction.

## 2013-05-21 NOTE — Telephone Encounter (Signed)
Confirmed upcoming appointment and reminded patient to get lab work done.

## 2013-06-21 LAB — LIPID PANEL
Chol/HDL Ratio: 2.5
Cholesterol, Total: 161
HDL: 65 mg/dL (ref 35–70)
LDL Calculated: 72 mg/dL (ref 0–160)
Triglycerides: 122 mg/dL
VLDL: 24 mg/dL

## 2013-06-21 LAB — HEMOGLOBIN A1C: Hemoglobin A1C: 6.3 %

## 2013-06-28 MED ORDER — ATORVASTATIN CALCIUM 40 MG PO TABS
40 MG | ORAL_TABLET | Freq: Every evening | ORAL | Status: DC
Start: 2013-06-28 — End: 2013-06-28

## 2013-06-28 MED ORDER — ATORVASTATIN CALCIUM 40 MG PO TABS
40 MG | ORAL_TABLET | Freq: Every evening | ORAL | Status: DC
Start: 2013-06-28 — End: 2014-07-30

## 2013-06-28 NOTE — Progress Notes (Signed)
Have you seen any other physician or provider since your last visit no  Have you had any other diagnostic tests since your last visit? yes - lab  Have you changed or stopped any medications since your last visit? no

## 2013-06-28 NOTE — Progress Notes (Signed)
Subjective:      Patient ID: Holly Bradford is a 47 y.o. female.    HPI:    Chief Complaint   Patient presents with   ??? 6 Month Follow-Up     med refilled     Checking sugars every so often, avg 110 in the am.    Down 7 lbs in March.  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  Rarely exercises, her knees bother her.  Does use the Wii occasionally.  Wt Readings from Last 3 Encounters:   06/28/13 323 lb 3.2 oz (146.603 kg)   03/11/13 330 lb (149.687 kg)   11/26/12 310 lb (140.615 kg)     BP elevated today.  No home checks.  Repeat 138/84.  BP Readings from Last 3 Encounters:   06/28/13 150/84   03/11/13 132/78   11/26/12 124/84     Patient Active Problem List   Diagnosis   ??? DM type 2 (diabetes mellitus, type 2)   ??? Hyperlipemia   ??? Elev transaminase/LDH   ??? Anxiety   ??? Vitamin D deficiency   ??? Medical non-compliance   ??? Stress incontinence   ??? Candidal intertrigo   ??? Morbid obesity     Past Surgical History   Procedure Laterality Date   ??? Breast surgery  biopsy 11/11   ??? Cholecystectomy  2001   ??? Cesarean section  [phone]   ??? Fracture surgery  1999   ??? Dilation and curettage of uterus  1991,1995       Lab Results   Component Value Date    LABA1C 6.3 06/21/2013     No results found for this basename: eAG       No components found with this basename: CHLPL     Lab Results   Component Value Date    TRIG 122 06/20/2013    TRIG 62 11/22/2012    TRIG 79 07/13/2012     Lab Results   Component Value Date    HDL 65 06/20/2013    HDL 70 59/04/6386    HDL 64 07/13/2012     Lab Results   Component Value Date    LDLCALC 72 06/20/2013    LDLCALC 107 11/22/2012    LDLCALC 144 07/13/2012     No results found for this basename: LABVLDL         Chemistry    No results found for this basename: NA, K, CL, CO2, BUN, CREATININE, GLU    No results found for this basename: CALCIUM, ALKPHOS, AST, ALT, BILITOT            No results found for this basename: TSH, T3TOTAL, T4TOTAL, THYROIDAB       No results found for this basename: WBC,  HGB, HCT, MCV, PLT         Health Maintenance   Topic Date Due   ??? Tetanus Vaccine Adult (11 Years And Up)  10/18/1978   ??? Cervical Cancer Screening  10/18/1988   ??? Breast Cancer Screening  10/23/2011   ??? Flu Vaccine Yearly (Adult)  08/19/2013         There is no immunization history on file for this patient.      Review of Systems   Constitutional: Negative.    HENT: Negative.    Respiratory: Negative.    Cardiovascular: Negative.    Gastrointestinal: Negative.    Musculoskeletal: Negative.    All other systems reviewed and are negative.  Objective:   Physical Exam   Constitutional: She is oriented to person, place, and time. She appears well-developed and well-nourished.   HENT:   Head: Normocephalic and atraumatic.   Right Ear: Tympanic membrane normal.   Left Ear: Tympanic membrane normal.   Mouth/Throat: Oropharynx is clear and moist and mucous membranes are normal.   Neck: Carotid bruit is not present.   Cardiovascular: Normal rate, regular rhythm and normal heart sounds.    No murmur heard.  Pulmonary/Chest: Effort normal and breath sounds normal.   Abdominal: Soft. Bowel sounds are normal.   Musculoskeletal: She exhibits no edema.   Neurological: She is alert and oriented to person, place, and time.   Monofilament neg     Skin: Skin is warm and dry.   Psychiatric: She has a normal mood and affect. Her behavior is normal.   Nursing note and vitals reviewed.      Assessment:      1. DM type 2 (diabetes mellitus, type 2)  Microalbumin, Ur    Hemoglobin A1c   2. Hyperlipemia     3. Morbid obesity     4. Vitamin D deficiency     5. Anxiety     6. Stress incontinence              Plan:      -  Chronic medical problems stable  -  Continue current medications  -  Labs reviewed with pt, look good  -  Encourage wt loss  -  Will recheck A1C in 6 mos  -  RTO 6 mos    Merle received counseling on the following healthy behaviors: medication adherence    Patient given educational materials on Diabetes    I have  instructed [name] to complete a self tracking handout on Blood Sugars  and instructed them to bring it with them to her next appointment.     Discussed use, benefit, and side effects of prescribed medications.  Barriers to medication compliance addressed.  All patient questions answered.  Pt voiced understanding.

## 2013-09-09 MED ORDER — KETOROLAC TROMETHAMINE 10 MG PO TABS
10 MG | ORAL_TABLET | Freq: Four times a day (QID) | ORAL | Status: DC | PRN
Start: 2013-09-09 — End: 2014-07-30

## 2013-09-09 NOTE — Telephone Encounter (Signed)
Pt was in with son today and was told to call if she had no relief with taking motrin for neck/shoulder tightness. Please send something to pharmacy. Pt LM on VM

## 2013-09-10 NOTE — Telephone Encounter (Signed)
Attempted to notify patient left voice mail message

## 2013-09-23 MED ORDER — SERTRALINE HCL 50 MG PO TABS
50 MG | ORAL_TABLET | ORAL | Status: DC
Start: 2013-09-23 — End: 2014-07-05

## 2013-12-23 NOTE — Telephone Encounter (Signed)
Confirmed upcoming appointment and reminded patient to get lab work done.

## 2014-03-27 LAB — ALBUMIN, RANDOM URINE
Creatine, Urine: 224.5 mg/dl
Microalb, Ur: 0.7 mg/dl (ref <1.9)
Microalbumin Creatinine Ratio: 3 ug/mg

## 2014-03-27 LAB — HEMOGLOBIN A1C
AVERAGE GLUCOSE: 141 mg/dL — ABNORMAL HIGH (ref 66–114)
Hemoglobin A1C: 6.7 % — ABNORMAL HIGH (ref 4.2–5.8)

## 2014-04-01 MED ORDER — PIOGLITAZONE HCL-METFORMIN HCL 15-500 MG PO TABS
15-500 MG | ORAL_TABLET | Freq: Two times a day (BID) | ORAL | Status: DC
Start: 2014-04-01 — End: 2014-07-30

## 2014-04-01 NOTE — Patient Instructions (Signed)
Patient Self-Management Goal for Chronic Condition  Goal: I will take all medications as prescribed by my doctor, and I will call the office if I am having any medication problems.  Barriers to success: none  Plan for overcoming my barriers: N/A     Confidence: 10/10  Date goal set: 04/01/2014  Date goal attained:       I have reviewed the provider's instructions with the patient, answering all questions to her satisfaction.    Learning About High Cholesterol  What is high cholesterol?  Cholesterol is a type of fat in your blood. It is needed for many body functions, such as making new cells. Cholesterol is made by your body. It also comes from food you eat.  If you have too much cholesterol, it starts to build up in your arteries. This is called hardening of the arteries, or atherosclerosis. High cholesterol raises your risk of a heart attack and stroke.  There are different types of cholesterol. LDL is the "bad" cholesterol. High LDL can raise your risk for heart disease, heart attack, and stroke. HDL is the "good" cholesterol. High HDL is linked with a lower risk for heart disease, heart attack, and stroke.  Your cholesterol levels help your doctor find out your risk for having a heart attack or stroke.  How can you prevent high cholesterol?  A heart-healthy lifestyle can help you prevent high cholesterol. This lifestyle helps lower your risk for a heart attack and stroke.  ?? Eat heart-healthy foods.  ?? Eat fruits, vegetables, whole grains (like oatmeal), dried beans and peas, nuts and seeds, soy products (like tofu), and fat-free or low-fat dairy products.  ?? Replace butter, margarine, and hydrogenated or partially hydrogenated oils with olive and canola oils. (Canola oil margarine without trans fat is fine.)  ?? Replace red meat with fish, poultry, and soy protein (like tofu).  ?? Limit processed and packaged foods like chips, crackers, and cookies.  ?? Be active. Exercise can improve your cholesterol level. Get  at least 30 minutes of exercise on most days of the week. Walking is a good choice. You also may want to do other activities, such as running, swimming, cycling, or playing tennis or team sports.  ?? Stay at a healthy weight. Lose weight if you need to.  ?? Don't smoke. If you need help quitting, talk to your doctor about stop-smoking programs and medicines. These can increase your chances of quitting for good.  How is high cholesterol treated?  The goal of treatment is to reduce your chances of having a heart attack or stroke. The goal is not to lower your cholesterol numbers only.  ?? You may make lifestyle changes, such as eating healthy foods, not smoking, losing weight, and being more active.  ?? You may have to take medicine.  Follow-up care is a key part of your treatment and safety. Be sure to make and go to all appointments, and call your doctor if you are having problems. It's also a good idea to know your test results and keep a list of the medicines you take.   Where can you learn more?   Go to [email or web address] and sign in to your MyChart account. Enter [id number] in the Search Health Information box to learn more about ???Learning About High Cholesterol.???    If you do not have an account, please click on the ???Sign Up Now??? link.     ?? 2006-2015 Healthwise, Incorporated. Care instructions adapted under license by [facility].  This care instruction is for use with your licensed healthcare professional. If you have questions about a medical condition or this instruction, always ask your healthcare professional. [place] any warranty or liability for your use of this information.  Content Version: 10.4.390249; Current as of: February 27, 2013              Diabetes Foot Care: After Your Visit  Your Care Instructions     When you have diabetes, your feet need extra care and attention. Diabetes can damage the nerve endings and blood vessels in your feet, making you less likely  to notice when your feet are injured. Diabetes also limits your body's ability to fight infection and get blood to areas that need it. If you get a minor foot injury, it could become an ulcer or a serious infection. With good foot care, you can prevent most of these problems.  Caring for your feet can be quick and easy. Most of the care can be done when you are bathing or getting ready for bed.  Follow-up care is a key part of your treatment and safety. Be sure to make and go to all appointments, and call your doctor if you are having problems. It???s also a good idea to know your test results and keep a list of the medicines you take.  How can you care for yourself at home?  ?? Keep your blood sugar close to normal by watching what and how much you eat, monitoring blood sugar, taking medicines if prescribed, and getting regular exercise.  ?? Do not smoke. Smoking affects blood flow and can make foot problems worse. If you need help quitting, talk to your doctor about stop-smoking programs and medicines. These can increase your chances of quitting for good.  ?? Eat a diet that is low in fats. High fat intake can cause fat to build up in your blood vessels and decrease blood flow.  ?? Inspect your feet daily for blisters, cuts, cracks, or sores. If you cannot see well, use a mirror or have someone help you.  ?? Take care of your feet:  ?? Wash your feet every day. Use warm (not hot) water. Check the water temperature with your wrists or other part of your body, not your feet.  ?? Dry your feet well. Pat them dry. Do not rub the skin on your feet too hard. Dry well between your toes. If the skin on your feet stays moist, bacteria or a fungus can grow, which can lead to infection.  ?? Keep your skin soft. Use moisturizing skin cream to keep the skin on your feet soft and prevent calluses and cracks. But do not put the cream between your toes, and stop using any cream that causes a rash.  ?? Clean underneath your toenails  carefully. Do not use a sharp object to clean underneath your toenails. Use the blunt end of a nail file or other rounded tool.  ?? Trim and file your toenails straight across to prevent ingrown toenails. Use a nail clipper, not scissors. Use an emery board to smooth the edges.  ?? Change socks daily. Socks without seams are best, because seams often rub the feet. You can find socks for people with diabetes from specialty catalogs.  ?? Look inside your shoes every day for things like gravel or torn linings, which could cause blisters or sores.  ?? Buy shoes that fit well:  ?? Look for shoes that have plenty of space  around the toes. This helps prevent bunions and blisters.  ?? Try on shoes while wearing the kind of socks you will usually wear with the shoes.  ?? Avoid plastic shoes. They may rub your feet and cause blisters. Good shoes should be made of materials that are flexible and breathable, such as leather or cloth.  ?? Break in new shoes slowly by wearing them for no more than an hour a day for several days. Take extra time to check your feet for red areas, blisters, or other problems after you wear new shoes.  ?? Do not go barefoot. Do not wear sandals, and do not wear shoes with very thin soles. Thin soles are easy to puncture. They also do not protect your feet from hot pavement or cold weather.  ?? Have your doctor check your feet during each visit. If you have a foot problem, see your doctor. Do not try to treat an early foot problem at home. Home remedies or treatments that you can buy without a prescription (such as corn removers) can be harmful.  ?? Always get early treatment for foot problems. A minor irritation can lead to a major problem if not properly cared for early.  When should you call for help?  Call your doctor now or seek immediate medical care if:  ?? You have a foot sore, an ulcer or break in the skin that is not healing after 4 days, bleeding corns or calluses, or an ingrown toenail.  ?? You have  blue or black areas, which can mean bruising or blood flow problems.  ?? You have peeling skin or tiny blisters between your toes or cracking or oozing of the skin.  ?? You have a fever for more than 24 hours and a foot sore.  ?? You have new numbness or tingling in your feet that does not go away after you move your feet or change positions.  ?? You have unexplained or unusual swelling of the foot or ankle.  Watch closely for changes in your health, and be sure to contact your doctor if:  ?? You cannot do proper foot care.   Where can you learn more?   Go to [email or web address] and sign in to your MyChart account. Enter A739 in the [place] box to learn more about ???Diabetes Foot Care: After Your Visit.???    If you do not have an account, please click on the ???Sign Up Now??? link.     ?? 2006-2015 Healthwise, Incorporated. Care instructions adapted under license by [facility]. This care instruction is for use with your licensed healthcare professional. If you have questions about a medical condition or this instruction, always ask your healthcare professional. [place] any warranty or liability for your use of this information.  Content Version: 10.4.390249; Current as of: November 01, 2013

## 2014-04-01 NOTE — Progress Notes (Signed)
Vim and questionnaire given to pt   Pt given Adacel vaccine 0.5 ml IM into left  deltoid    Pt tolerated well and no reaction noted       [facility] Apr [facility] at 13o  Pt given order and informed needs to take to appt

## 2014-04-01 NOTE — Progress Notes (Signed)
Have you seen any other physician or provider since your last visit yes [facility]     Have you had any other diagnostic tests since your last visit? yes - labs    Have you changed or stopped any medications since your last visit including any over-the-counter medicines, vitamins, or herbal medicines? yes - see med list     Are you taking all your prescribed medications? Yes    If NO, why?               BP Readings from Last 2 Encounters:   04/01/14 132/82   06/28/13 138/84     Hemoglobin A1C (%)   Date Value   03/26/2014 6.7*        LDL Calculated (mg/dL)   Date Value   1/6/10967/02/2013 72               Tobacco use:  Patient  reports that she has never smoked. She has never used smokeless tobacco.    If Smoker - Cessation materials given? No    Is Daily aspirin on medication list?:  No    Diabetic retinal exam done? No   If yes, document in Health Maintenance.     Monofilament placed on counter? Yes    Shoes and socks removed? Yes    BP taken with correct size cuff? Yes   Repeated if > 140/90 NA     Is patient taking any medications for diabetes    Yes   If yes, see medication list    Is the patient reporting any side effects of diabetic medications?   No    Microalbumin performed if applicable?  NA      Is patient taking any over the counter medications    Yes   If yes, see medication list      No components found with this basename: CHLPL     Lab Results   Component Value Date    TRIG 122 06/20/2013     Lab Results   Component Value Date    HDL 65 06/20/2013     Lab Results   Component Value Date    LDLCALC 72 06/20/2013     No results found for this basename: LABVLDL       No results found for this basename: ALT, AST, GGT, ALKPHOS, BILITOT           Is patient currently taking any cholesterol medications?     Yes   If yes, see med list as above    Is the patient reporting any side effects of cholesterol medications?   No    Is the patient taking any over the counter medications?    Yes   If yes, see med list as  above    Is the patient taking a daily aspirin?    No

## 2014-04-01 NOTE — Progress Notes (Signed)
Subjective:      Patient ID: Holly Bradford is a 48 y.o. female.    HPI:    Chief Complaint   Patient presents with   ??? 6 Month Follow-Up     DM , Lipids    ??? Discuss Labs     6 month eval.  Doing well.    Was recently seen in [facility] ED for CP.  Work up was negative.  Was moved to [facility] for testing.  Had stress and echo per pt that were negative.  Attributes to esophageal spasm after eating a "swedish fish".    Sugars well controlled.  Under a lot of stress.  Not checking on regular basis.  Son has been using drugs, cocaine, shrooms, pot.  Had to be de-toxed.  Lab Results   Component Value Date    LABA1C 6.7* 03/26/2014     Drinking more pop and eating poorly.    Wt Readings from Last 3 Encounters:   04/01/14 327 lb 6.4 oz (148.508 kg)   06/28/13 323 lb 3.2 oz (146.603 kg)   03/11/13 330 lb (149.687 kg)     Patient Active Problem List   Diagnosis   ??? DM type 2 (diabetes mellitus, type 2)   ??? Hyperlipemia   ??? Elev transaminase/LDH   ??? Anxiety   ??? Vitamin D deficiency   ??? Medical non-compliance   ??? Stress incontinence   ??? Candidal intertrigo   ??? Morbid obesity     Past Surgical History   Procedure Laterality Date   ??? Breast surgery  biopsy 11/11   ??? Cholecystectomy  2001   ??? Cesarean section  [phone]   ??? Fracture surgery  1999   ??? Dilation and curettage of uterus  1991,1995       Lab Results   Component Value Date    LABA1C 6.7* 03/26/2014     No results found for this basename: eAG       No components found with this basename: CHLPL     Lab Results   Component Value Date    TRIG 122 06/20/2013    TRIG 62 11/22/2012    TRIG 79 07/13/2012     Lab Results   Component Value Date    HDL 65 06/20/2013    HDL 70 11/22/2012    HDL 64 07/13/2012     Lab Results   Component Value Date    LDLCALC 72 06/20/2013    LDLCALC 107 11/22/2012    LDLCALC 144 07/13/2012     No results found for this basename: LABVLDL         Chemistry    No results found for this basename: NA, K, CL, CO2, BUN, CREATININE, GLU    No results  found for this basename: CALCIUM, ALKPHOS, AST, ALT, BILITOT            No results found for this basename: TSH, T3TOTAL, T4TOTAL, THYROIDAB       No results found for this basename: WBC, HGB, HCT, MCV, PLT         Health Maintenance   Topic Date Due   ??? FOOT EXAM  10/18/1977   ??? OPHTHALMOLOGY EXAM  10/18/1977   ??? TETANUS VACCINE ADULT (11 YEARS AND UP)  10/18/1978   ??? CERVICAL CANCER SCREENING  10/18/1988   ??? BREAST CANCER SCREENING  10/23/2011   ??? LIPIDS  06/20/2014   ??? FLU VACCINE YEARLY (ADULT)  08/19/2014   ??? HEMOGLOBIN A1C  09/25/2014   ???  MICROALBUMINURIA  03/27/2015         There is no immunization history on file for this patient.      Review of Systems   Constitutional: Negative.    HENT: Negative.    Respiratory: Negative.    Cardiovascular: Negative.    Gastrointestinal: Negative.    Musculoskeletal: Negative.    All other systems reviewed and are negative.      Objective:   Physical Exam   Constitutional: She is oriented to person, place, and time. She appears well-developed and well-nourished.   HENT:   Head: Normocephalic and atraumatic.   Right Ear: Tympanic membrane normal.   Left Ear: Tympanic membrane normal.   Mouth/Throat: Oropharynx is clear and moist and mucous membranes are normal.   Neck: Carotid bruit is not present.   Cardiovascular: Normal rate, regular rhythm and normal heart sounds.    No murmur heard.  Pulmonary/Chest: Effort normal and breath sounds normal.   Abdominal: Soft. Bowel sounds are normal.   Musculoskeletal: She exhibits no edema.   Neurological: She is alert and oriented to person, place, and time.   Monofilament neg   Skin: Skin is warm and dry.   Psychiatric: She has a normal mood and affect. Her behavior is normal.   Nursing note and vitals reviewed.      Assessment:      1. Atypical chest pain     2. DM type 2 (diabetes mellitus, type 2) (HCC)  pioglitazone-metformin (ACTOPLUS MET) 15-500 MG per tablet    Comprehensive Metabolic Panel    Hemoglobin A1C   3. Hyperlipemia   Lipid Panel    Comprehensive Metabolic Panel   4. Morbid obesity     5. Anxiety     6. Screening for breast cancer  MAM Digital Screen Bilateral   7. Need for Tdap vaccination  Tdap vaccine greater than or equal to 7yo IM            Plan:      -  Chronic medical problems stable  -  Continue current medications  -  A1C jumped a little to 6.7, dietary changes discussed  -  Order for mammogram given  -  Tdap today  -  Recheck labs 6 mos  -  RTO 6 mos    Holly Bradford received counseling on the following healthy behaviors: medication adherence    Patient given educational materials on Diabetes    I have instructed Holly Bradford to complete a self tracking handout on Blood Sugars  and instructed them to bring it with them to her next appointment.     Discussed use, benefit, and side effects of prescribed medications.  Barriers to medication compliance addressed.  All patient questions answered.  Pt voiced understanding.

## 2014-04-02 NOTE — Telephone Encounter (Signed)
Scheduled mammo at [facility] for April 20 at 130  Pt to arrive at 115  Pt informed of appt and prep

## 2014-04-09 ENCOUNTER — Encounter

## 2014-07-07 MED ORDER — SERTRALINE HCL 50 MG PO TABS
50 MG | ORAL_TABLET | ORAL | Status: DC
Start: 2014-07-07 — End: 2015-07-21

## 2014-07-22 LAB — LIPID PANEL
Chol/HDL Ratio: 2.4 (ref <5)
Cholesterol: 151 mg/dl (ref <200)
HDL: 64 mg/dl — ABNORMAL HIGH (ref 40–60)
LDL Calculated: 72 mg/dL (ref <130)
LDL/HDL Ratio: 1.1 (ref <3.5)
Triglycerides: 76 mg/dl (ref <150)
VLDL Cholesterol Calculated: 15 mg/dL (ref <30)

## 2014-07-22 LAB — COMPREHENSIVE METABOLIC PANEL
ALT: 34 IU/L (ref 5–59)
AST: 27 IU/L (ref 10–42)
Albumin: 4 g/dl (ref 3.2–5.3)
Alk Phosphatase: 61 IU/L (ref 35–121)
Anion Gap: 12
BUN: 9 mg/dl — ABNORMAL LOW (ref 10–20)
CO2: 25 mmol/L (ref 21–32)
Calcium: 8.9 mg/dl (ref 8.7–10.8)
Chloride: 102 mmol/L (ref 95–111)
Creatinine: 0.7 mg/dl (ref 0.5–1.3)
EGFR IF NonAfrican American: 90 (ref >60)
Glucose: 167 mg/dl — ABNORMAL HIGH (ref 70–100)
Potassium: 4.1 mmol/L (ref 3.5–5.4)
Sodium: 135 mmol/L (ref 134–147)
Total Bilirubin: 0.9 mg/dl (ref 0.2–1.3)
Total Protein: 6.6 g/dl (ref 5.8–8.0)
eGFR African American: 109 (ref >60)

## 2014-07-22 LAB — HEMOGLOBIN A1C
AVERAGE GLUCOSE: 189 mg/dL — ABNORMAL HIGH (ref 66–114)
Hemoglobin A1C: 8.3 % — ABNORMAL HIGH (ref 4.2–5.8)

## 2014-07-30 MED ORDER — ATORVASTATIN CALCIUM 40 MG PO TABS
40 MG | ORAL_TABLET | Freq: Every evening | ORAL | Status: DC
Start: 2014-07-30 — End: 2015-07-21

## 2014-07-30 MED ORDER — VITAMIN D (ERGOCALCIFEROL) 1.25 MG (50000 UT) PO CAPS
1.25 MG (50000 UT) | ORAL_CAPSULE | ORAL | Status: DC
Start: 2014-07-30 — End: 2015-07-21

## 2014-07-30 MED ORDER — SERTRALINE HCL 50 MG PO TABS
50 MG | ORAL_TABLET | Freq: Every day | ORAL | Status: DC
Start: 2014-07-30 — End: 2014-09-15

## 2014-07-30 MED ORDER — PIOGLITAZONE HCL-METFORMIN HCL 15-500 MG PO TABS
15-500 MG | ORAL_TABLET | ORAL | Status: DC
Start: 2014-07-30 — End: 2015-07-21

## 2014-07-30 NOTE — Progress Notes (Signed)
Have you seen any other physician or provider since your last visit no  Have you had any other diagnostic tests since your last visit? yes - lab  Have you changed or stopped any medications since your last visit? no

## 2014-07-30 NOTE — Progress Notes (Signed)
Subjective:      Patient ID: Holly Bradford is a 48 y.o. female.    HPI:    Chief Complaint   Patient presents with   ??? Annual Exam     wellness exam  OB/GYN does pap       Annual eval.    Not UTD with pap, plans to schedule with Dr. Catalina LungerStallkamp.    UTD with mammogram.    Sugars elevated, stopped taking meds for a while.  Back on since July.    Lab Results   Component Value Date    LABA1C 8.3* 07/21/2014         Wt Readings from Last 3 Encounters:   07/30/14 317 lb 12.8 oz (144.153 kg)   04/01/14 327 lb 6.4 oz (148.508 kg)   06/28/13 323 lb 3.2 oz (146.603 kg)       Patient Active Problem List   Diagnosis   ??? DM type 2 (diabetes mellitus, type 2)   ??? Hyperlipemia   ??? Elev transaminase/LDH   ??? Anxiety   ??? Vitamin D deficiency   ??? Medical non-compliance   ??? Stress incontinence   ??? Candidal intertrigo   ??? Morbid obesity     Past Surgical History   Procedure Laterality Date   ??? Breast surgery  biopsy 11/11   ??? Cholecystectomy  2001   ??? Cesarean section  [phone],1994,1996,1997   ??? Fracture surgery  1999   ??? Dilation and curettage of uterus  [phone]         Review of Systems   Constitutional: Negative.    HENT: Negative.    Respiratory: Negative.    Cardiovascular: Negative.    Gastrointestinal: Negative.    Musculoskeletal: Negative.    All other systems reviewed and are negative.      Objective:   Physical Exam   Constitutional: She is oriented to person, place, and time. She appears well-developed and well-nourished.   HENT:   Head: Normocephalic and atraumatic.   Right Ear: Tympanic membrane normal.   Left Ear: Tympanic membrane normal.   Mouth/Throat: Oropharynx is clear and moist and mucous membranes are normal.   Cardiovascular: Normal rate, regular rhythm and normal heart sounds.    No murmur heard.  Pulmonary/Chest: Effort normal and breath sounds normal.   Abdominal: Soft. Bowel sounds are normal.   Musculoskeletal: She exhibits no edema.   Neurological: She is alert and oriented to person, place, and time.    Skin: Skin is warm and dry.   Psychiatric: She has a normal mood and affect. Her behavior is normal.   Nursing note and vitals reviewed.      Assessment:      1. Well adult health check             Plan:      -  Healthy lifestyle discussed  -  Weight loss discussed  -  UTD with Tdap and mammogram  -  Pt to schedule pap with Dr. Catalina LungerStallkamp  -  A1C elevated, pt off meds for a few months  -  Compliance discussed  -  Recheck A1C 3 mos  -  RTO 3 mos

## 2014-07-31 LAB — URINE WITH REFLEXED MICRO
Bilirubin: NEGATIVE
Glucose: NEGATIVE
Ketones, Urine: NEGATIVE
Nitrite, Urine: NEGATIVE
Protein, Urine: NEGATIVE
Spec Grav, Fluid: 1.01 (ref 1.001–1.03)
Urobilinogen, Urine: NORMAL
pH: 5.5 (ref 5.0–9.0)

## 2014-07-31 LAB — URINALYSIS WITH MICROSCOPIC

## 2014-07-31 MED ORDER — CIPROFLOXACIN HCL 250 MG PO TABS
250 MG | ORAL_TABLET | Freq: Two times a day (BID) | ORAL | Status: AC
Start: 2014-07-31 — End: 2014-08-03

## 2014-07-31 NOTE — Telephone Encounter (Signed)
[facility]

## 2014-07-31 NOTE — Telephone Encounter (Signed)
UA shows possible infection.  rx for abx sent.  Waiting on culture.

## 2014-07-31 NOTE — Telephone Encounter (Signed)
Patient calling and requesting urine test results and also requesting ATB.  Please advise

## 2014-07-31 NOTE — Telephone Encounter (Signed)
I have not seen results from the UA yet.  Please find out where she gave sample and attempt to get report.

## 2014-08-02 LAB — CULTURE, URINE

## 2014-09-15 MED ORDER — AMOXICILLIN-POT CLAVULANATE 875-125 MG PO TABS
875-125 MG | ORAL_TABLET | Freq: Two times a day (BID) | ORAL | Status: AC
Start: 2014-09-15 — End: 2014-09-25

## 2014-09-15 NOTE — Progress Notes (Signed)
Subjective:      Patient ID: Holly Bradford is a 48 y.o. female.    HPI: Acute for nose sore    Chief Complaint   Patient presents with   ??? 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 Fall     last night  injuried hand b/l and left leg        Onset 13 years with cyst on the right side of the nostril.  Had complications with it at that time.  Bursted open.  Never investigated after that as it did not bother her.  Onset of 1 week feels like it is growing and TTP.   ST.  Bad breath.  Sinus congestion.    Stepped into a hole and fell.  DOI 09/14/14.  Landed on her left leg and left hand.  TTP.  No bruising or swelling.  No limitations with walking.  Grip strength is weaken.    BP Readings from Last 3 Encounters:   09/15/14 138/70   07/30/14 144/86   04/01/14 132/82       BP did come down after initial high.  Will continue to monitor.  Not on any BP medications.     Review of Systems   Constitutional: Negative for fever and chills.   HENT: Positive for congestion, postnasal drip, rhinorrhea and sore throat.    Respiratory: Negative for cough and shortness of breath.    Cardiovascular: Negative for chest pain.   Gastrointestinal: Negative for nausea and abdominal pain.   Musculoskeletal: Positive for myalgias.   Skin: Negative for rash.   Neurological: Negative for dizziness, light-headedness and headaches.   Psychiatric/Behavioral: Negative.        Objective:   Physical Exam   Constitutional: She is oriented to person, place, and time. Vital signs are normal. She appears well-developed and well-nourished. She is active. She does not have a sickly appearance. No distress.   HENT:   Right Ear: Tympanic membrane normal.   Left Ear: Tympanic membrane normal.   Nose: Mucosal edema and rhinorrhea present.       Mouth/Throat: Oropharynx is clear and moist and mucous membranes are normal.   Cardiovascular: Normal rate, regular rhythm, S1 normal, S2 normal, normal heart sounds and normal  pulses.  Exam reveals no S3.    No murmur heard.  Pulmonary/Chest: Effort normal and breath sounds normal. She has no decreased breath sounds. She has no wheezes. She has no rhonchi.   Abdominal: Soft. Bowel sounds are normal. There is no tenderness.   Musculoskeletal:        Left hand: She exhibits tenderness. She exhibits normal range of motion and no bony tenderness. Normal sensation noted. Normal strength noted.        Left lower leg: She exhibits tenderness. She exhibits no bony tenderness, no swelling and no edema.   Neurological: She is alert and oriented to person, place, and time.   Psychiatric: She has a normal mood and affect. Her behavior is normal.       Assessment:      1. Nasal abscess  amoxicillin-clavulanate (AUGMENTIN) 875-125 MG per tablet   2. Contusion of leg, left, initial encounter     3. Hand sprain, left, initial encounter     4. Elevated BP              Plan:      Augmentin BID x  10 days  Hold off on CT Sinus - only other complications was 15 years ago  If no improvement will CT Sinus  Reassurance over #2 and #3  Continue to monitor BP  RTO if symptoms worsen or stay the same

## 2014-10-30 ENCOUNTER — Encounter: Payer: PRIVATE HEALTH INSURANCE | Attending: Family Medicine | Primary: Family Medicine

## 2014-11-07 MED ORDER — SERTRALINE HCL 50 MG PO TABS
50 MG | ORAL_TABLET | ORAL | Status: DC
Start: 2014-11-07 — End: 2015-07-21

## 2014-11-25 ENCOUNTER — Encounter: Payer: PRIVATE HEALTH INSURANCE | Attending: Family Medicine | Primary: Family Medicine

## 2014-12-22 ENCOUNTER — Ambulatory Visit
Admit: 2014-12-22 | Discharge: 2014-12-22 | Payer: PRIVATE HEALTH INSURANCE | Attending: Family Medicine | Primary: Family Medicine

## 2014-12-22 DIAGNOSIS — J40 Bronchitis, not specified as acute or chronic: Secondary | ICD-10-CM

## 2014-12-22 MED ORDER — DOXYCYCLINE HYCLATE 100 MG PO TABS
100 MG | ORAL_TABLET | Freq: Two times a day (BID) | ORAL | Status: AC
Start: 2014-12-22 — End: 2015-01-01

## 2014-12-22 NOTE — Progress Notes (Signed)
Have you seen any other physician or provider since your last visit no    Have you had any other diagnostic tests since your last visit? no    Have you changed or stopped any medications since your last visit including any over-the-counter medicines, vitamins, or herbal medicines? no     Are you taking all your prescribed medications? Yes    If NO, why?

## 2014-12-22 NOTE — Progress Notes (Signed)
Subjective:      Patient ID: Holly Bradford is a 49 y.o. female.    HPI  Encounter Diagnosis   Name Primary?   ??? Tracheobronchitis Yes     Patient is here after starting with flu symptoms a week ago.  Her head and sinus congestion has started clearing but now a week later, it is moved into her chest and she is bringing up yellowish brown sputum with some blood tinging.  She has not been running a fever and is concerned now that she is going into pneumonia.    I believe she may be going into a post viral bacterial bronchitis and will start her on an antibiotic for that reason.    Review of Systems   Constitutional: Positive for fatigue.   HENT: Positive for congestion. Negative for rhinorrhea and sinus pressure.    Respiratory: Positive for cough. Negative for chest tightness, shortness of breath and wheezing.    Cardiovascular: Negative.    All other systems reviewed and are negative.      Objective:   Physical Exam   Constitutional: She is oriented to person, place, and time. She appears well-developed and well-nourished.   HENT:   Right Ear: External ear normal.   Left Ear: External ear normal.   Nose: Nose normal.   Mouth/Throat: Oropharynx is clear and moist.   Eyes: Conjunctivae are normal.   Cardiovascular: Normal rate and regular rhythm.    Pulmonary/Chest: Effort normal. She has no decreased breath sounds. She has no wheezes. She has rhonchi. She has no rales.   Musculoskeletal: She exhibits no edema.   Neurological: She is alert and oriented to person, place, and time.   Skin: Skin is warm and dry. No rash noted.   Nursing note and vitals reviewed.      Assessment:      1. Tracheobronchitis  doxycycline (VIBRA-TABS) 100 MG tablet            Plan:      No orders of the defined types were placed in this encounter.     There are no discontinued medications.  Current Outpatient Prescriptions   Medication Sig Dispense Refill   ??? doxycycline (VIBRA-TABS) 100 MG tablet Take 1 tablet by mouth 2 times daily for  10 days 20 tablet 0   ??? sertraline (ZOLOFT) 50 MG tablet TAKE 1 TABLET BY MOUTH DAILY. 90 tablet 0   ??? atorvastatin (LIPITOR) 40 MG tablet Take 1 tablet by mouth nightly 90 tablet 3   ??? pioglitazone-metformin (ACTOPLUS MET) 15-500 MG per tablet TAKE 1 TABLET BY MOUTH TWICE A DAY WITH MEALS 180 tablet 3   ??? vitamin D (ERGOCALCIFEROL) 50000 UNITS CAPS capsule TAKE 1 CAPSULE BY MOUTH ONCE A WEEK. 12 capsule 3   ??? sertraline (ZOLOFT) 50 MG tablet TAKE 1 TABLET BY MOUTH DAILY. 90 tablet 2   ??? Fish Oil OIL by Does not apply route daily.     ??? Naproxen Sodium (ALEVE PO) Take  by mouth nightly.       No current facility-administered medications for this visit.     Start the doxycycline today.  Push fluids.  Recheck as needed.

## 2014-12-22 NOTE — Patient Instructions (Signed)
Start the doxycycline today.  Push fluids.  Recheck as needed.

## 2015-05-20 ENCOUNTER — Encounter

## 2015-05-20 ENCOUNTER — Inpatient Hospital Stay: Admit: 2015-05-20 | Payer: PRIVATE HEALTH INSURANCE | Attending: Family Medicine | Primary: Family Medicine

## 2015-05-20 DIAGNOSIS — Z1231 Encounter for screening mammogram for malignant neoplasm of breast: Secondary | ICD-10-CM

## 2015-06-23 ENCOUNTER — Telehealth

## 2015-06-23 NOTE — Telephone Encounter (Signed)
Labs mailed to patient.

## 2015-06-23 NOTE — Telephone Encounter (Signed)
Spouse requesting lab order to have labs done prior to appt.  Please mail to new address.  Please advise

## 2015-07-16 LAB — LIPID PANEL
Cholesterol, Total: 216 mg/dL
HDL: 65 mg/dL (ref 35–70)
LDL Calculated: 126 mg/dL (ref 0–160)
Triglycerides: 125 mg/dL
VLDL: 25 mg/dL

## 2015-07-16 LAB — HEMOGLOBIN A1C: Hemoglobin A1C: 9.8 %

## 2015-07-16 LAB — TSH WITH REFLEX: TSH: 1.81 u[IU]/mL

## 2015-07-21 ENCOUNTER — Ambulatory Visit
Admit: 2015-07-21 | Discharge: 2015-07-21 | Payer: PRIVATE HEALTH INSURANCE | Attending: Family Medicine | Primary: Family Medicine

## 2015-07-21 DIAGNOSIS — IMO0001 Reserved for inherently not codable concepts without codable children: Secondary | ICD-10-CM

## 2015-07-21 MED ORDER — METHYLPREDNISOLONE ACETATE 80 MG/ML IJ SUSP
80 MG/ML | Freq: Once | INTRAMUSCULAR | Status: AC
Start: 2015-07-21 — End: ?

## 2015-07-21 MED ORDER — ATORVASTATIN CALCIUM 40 MG PO TABS
40 MG | ORAL_TABLET | Freq: Every evening | ORAL | 0 refills | Status: AC
Start: 2015-07-21 — End: ?

## 2015-07-21 MED ORDER — RAMIPRIL 2.5 MG PO CAPS
2.5 MG | ORAL_CAPSULE | Freq: Every day | ORAL | 0 refills | Status: DC
Start: 2015-07-21 — End: 2016-04-06

## 2015-07-21 MED ORDER — VITAMIN D (ERGOCALCIFEROL) 1.25 MG (50000 UT) PO CAPS
1.25 MG (50000 UT) | ORAL_CAPSULE | ORAL | 0 refills | Status: AC
Start: 2015-07-21 — End: ?

## 2015-07-21 MED ORDER — PIOGLITAZONE HCL-METFORMIN HCL 15-500 MG PO TABS
15-500 MG | ORAL_TABLET | ORAL | 0 refills | Status: AC
Start: 2015-07-21 — End: ?

## 2015-07-21 NOTE — Patient Instructions (Addendum)
Oral Therapy for Type 2 Diabetes: Care Instructions  Your Care Instructions  People with type 2 diabetes have two problems that lead to high blood sugar. Their bodies don't make enough insulin to control their blood sugar. And their bodies don't respond well to insulin when it is present. Oral medicines for type 2 diabetes can raise your insulin. They also help your body use insulin better. You take these drugs by mouth. Sometimes they are combined with insulin.  Some examples of these medicines are:  ?? Sulfonylureas. These help the body make more insulin. They include glipizide and glyburide.  ?? Meglitinides. These also help your body make insulin. They include nateglinide and repaglinide.  ?? Metformin. This lowers how much glucose your liver makes. And it helps you respond better to insulin.  ?? Thiazolidinediones. These also reduce the amount of blood glucose. They also help you respond better to insulin. These medicines include pioglitazone and rosiglitazone.  ?? Alpha-glucosidase inhibitors. These keep starches from breaking down. This means that they lower the amount of glucose absorbed when you eat. They include acarbose and miglitol.  ?? DPP-4 inhibitors. These help the body make more insulin. They also help the body make less of a hormone that raises blood sugar. They include linagliptin, saxagliptin, and sitagliptin.  ?? Sodium glucose co-transporter 2 inhibitors. These help to remove extra glucose through your urine. They may also help some people lose weight. These medicines are also called SGLT2 inhibitors. They include canagliflozin, dapagliflozin, and empagliflozin.  You may take one or more of these drugs. You and your doctor can choose the best ones for you. If pills can't control your blood sugar, you may need to use medicine that you take as a shot. These include amylinomimetics, incretin mimetics, and insulin.  You may need to use insulin for a short time if you get sick or need surgery. Drugs that  you take by mouth may not control blood sugar when your body is stressed.  Follow-up care is a key part of your treatment and safety. Be sure to make and go to all appointments, and call your doctor if you are having problems. It's also a good idea to know your test results and keep a list of the medicines you take.  How can you care for yourself at home?  ?? Eat a healthy diet. Get some exercise each day. This may help you to reduce how much medicine you need.  ?? Do not take other prescription or over-the-counter medicines, vitamins, herbal products, or supplements without talking to your doctor first. Some medicines for type 2 diabetes can cause problems with other medicines or supplements.  ?? Tell your doctor if you plan to get pregnant. Some of these drugs are not safe for pregnant women.  ?? Be safe with medicines. Take your medicines exactly as prescribed. Sulfonylureas or meglitinides can cause your blood sugar to drop very low. Call your doctor if you think you are having a problem with your medicine.  ?? Check your blood sugar levels often. You can use a glucose monitor. Keeping track of blood glucose levels can help you know how certain foods, activities, and medicines affect your blood sugar. And it can help you keep your blood sugar from getting so low that it's not safe.  When should you call for help?  Call 911 anytime you think you may need emergency care. For example, call if:  ?? You passed out (lost consciousness), or you suddenly become very sleepy or   confused. (You may have very low blood sugar.)  ?? You have symptoms of high blood sugar, such as:  ?? Blurred vision.  ?? Trouble staying awake or being woken up.  ?? Fast, deep breathing.  ?? Breath that smells fruity.  ?? Belly pain, not feeling hungry, and vomiting.  ?? Feeling confused.  Call your doctor now or seek immediate medical care if:  ?? You are sick and cannot control your blood sugar.  ?? You have been vomiting or have had diarrhea for more than 6  hours.  ?? Your blood sugar stays higher than the level your doctor has set for you.  ?? You have symptoms of low blood sugar, such as:  ?? Sweating.  ?? Feeling nervous, shaky, and weak.  ?? Extreme hunger and slight nausea.  ?? Dizziness and headache.  ?? Blurred vision.  ?? Confusion.  Watch closely for changes in your health, and be sure to contact your doctor if:  ?? You have a hard time knowing when your blood sugar is low.  ?? You have trouble keeping your blood sugar in the target range.  ?? You often have problems controlling your blood sugar.  ?? You have symptoms of long-term diabetes problems, such as:  ?? New vision changes.  ?? New pain, numbness, or tingling in your hands or feet.  ?? Skin problems.   Where can you learn more?   Go to [email or web address] and sign in to your MyChart account. Enter H153 in the Search Health Information box to learn more about ???Oral Therapy for Type 2 Diabetes: Care Instructions.???    If you do not have an account, please click on the ???Sign Up Now??? link.   ?? 2006-2016 [employer]. Care instructions adapted under license by [facility]. This care instruction is for use with your licensed healthcare professional. If you have questions about a medical condition or this instruction, always ask your healthcare professional. Health[employer], Inc[employer] disclaims any warranty or liability for your use of this information.  Content Version: 10.8.513193; Current as of: May 09, 2014      I have reviewed the provider's instructions with the patient, answering all questions to her satisfaction.  Patient Self-Management Goal for Chronic Condition  Goal: I will take all medications as prescribed by my doctor, and I will call the office if I am having any medication problems.  Barriers to success: none  Plan for overcoming my barriers: N/A     Confidence: 10/10  Date goal set: 07/21/15  Date goal attained:

## 2015-07-21 NOTE — Progress Notes (Signed)
Have you seen any other physician or provider since your last visit no  Have you had any other diagnostic tests since your last visit? yes - mammo, lab  Have you changed or stopped any medications since your last visit? yes -  Med list addressed   BP Readings from Last 2 Encounters:   07/21/15 118/70   12/22/14 120/82     HEMOGLOBIN A1C (%)   Date Value   07/16/2015 9.8     LDL CALCULATED (mg/dL)   Date Value   52/84/132407/28/2016 126              Tobacco use:  Patient  reports that she has never smoked. She has never used smokeless tobacco.  If Smoker - Cessation materials given? NA  Is Daily aspirin on medication list?:  No  Diabetic retinal exam done? Yes   If yes, document in Health Maintenance.   Monofilament placed on counter? Yes  Shoes and socks removed? Yes  BP taken with correct size cuff? Yes   Repeated if > 140/90 NA   Is patient taking any medications for diabetes    Yes   If yes, see medication list  Is the patient reporting any side effects of diabetic medications?   No  Microalbumin performed if applicable?  NA    Is patient taking any over the counter medications    Yes   If yes, see medication list

## 2015-07-21 NOTE — Progress Notes (Signed)
Subjective:      Patient ID: Holly Bradford is a 49 y.o. female.    HPI:    Chief Complaint   Patient presents with   ??? Annual Exam     Annual eval.  Recently moved to [facility], plans to continue to come here for appts.    Sugar too high, not taking her meds.  Has not taken for months.  Denies polyuria and polydipsia.  Lab Results   Component Value Date    LABA1C 9.8 07/16/2015     BPs controlled.    BP Readings from Last 3 Encounters:   07/21/15 118/70   12/22/14 120/82   09/15/14 138/70     Pt not taking Lipitor either at this time.    C/o right knee pain, requesting cortizone injection.    Patient Active Problem List   Diagnosis   ??? DM type 2 (diabetes mellitus, type 2) ([place])   ??? Hyperlipemia   ??? Nonspecific elevation of levels of transaminase or lactic acid dehydrogenase (LDH)   ??? Anxiety   ??? Vitamin D deficiency   ??? Medical non-compliance   ??? Stress incontinence   ??? Candidal intertrigo   ??? Morbid obesity ([place])   ??? Uncontrolled type 2 diabetes mellitus without complication, without long-term current use of insulin ([place])   ??? Uncontrolled diabetes mellitus type 2 without complications ([place])   ??? NASH (nonalcoholic steatohepatitis)     Past Surgical History   Procedure Laterality Date   ??? Breast surgery  biopsy 11/11   ??? Cholecystectomy  2001   ??? Cesarean section  [phone]   ??? Fracture surgery  1999   ??? Dilation and curettage of uterus  1991,1995       Lab Results   Component Value Date    LABA1C 9.8 07/16/2015     No results found for: EAG    No components found for: CHLPL  Lab Results   Component Value Date    TRIG 125 07/16/2015    TRIG 76 07/21/2014    TRIG 122 06/20/2013     Lab Results   Component Value Date    HDL 65 07/16/2015    HDL 64 (H) 07/21/2014    HDL 65 06/20/2013     Lab Results   Component Value Date    LDLCALC 126 07/16/2015    LDLCALC 72 07/21/2014    LDLCALC 72 06/20/2013     Lab Results   Component Value Date    LABVLDL 15 07/21/2014         Chemistry        Component Value Date/Time     NA 135 07/21/2014 1045    K 4.1 07/21/2014 1045    CL 102 07/21/2014 1045    CO2 25 07/21/2014 1045    BUN 9 (L) 07/21/2014 1045    CREATININE 0.7 07/21/2014 1045        Component Value Date/Time    CALCIUM 8.9 07/21/2014 1045    ALKPHOS 61 07/21/2014 1045    AST 27 07/21/2014 1045    ALT 34 07/21/2014 1045    BILITOT NEGATIVE 07/30/2014 0854    BILITOT 0.9 07/21/2014 1045            Lab Results   Component Value Date    TSH 1.810 07/16/2015       Lab Results   Component Value Date    WBC 30-50 (H) 07/30/2014         Health Maintenance  Topic Date Due   ??? HIV SCREENING  10/18/1982   ??? PNEUMOVAX 1 DOSE 19-64Y (1) 10/18/1985   ??? OPHTHALMOLOGY EXAM  07/19/2014   ??? MICROALBUMINURIA  03/27/2015   ??? FOOT EXAM  04/02/2015   ??? FLU VACCINE YEARLY (ADULT)  07/20/2015   ??? HEMOGLOBIN A1C  01/16/2016   ??? LIPIDS  07/15/2016   ??? CERVICAL CANCER SCREENING  07/30/2017   ??? TETANUS VACCINE ADULT (11 YEARS AND UP)  04/01/2024       Immunization History   Administered Date(s) Administered   ??? DTaP (Daptacel, Infanrix, [employer]edia) 04/01/2014   ??? Tdap (Boostrix, Adacel) 04/01/2014         Review of Systems   Constitutional: Negative.    HENT: Negative.    Respiratory: Negative.    Cardiovascular: Negative.    Gastrointestinal: Negative.    Musculoskeletal: Positive for arthralgias (right knee pain).   All other systems reviewed and are negative.      Objective:   Physical Exam   Constitutional: She is oriented to person, place, and time. She appears well-developed and well-nourished.   HENT:   Head: Normocephalic and atraumatic.   Right Ear: Tympanic membrane normal.   Left Ear: Tympanic membrane normal.   Mouth/Throat: Oropharynx is clear and moist and mucous membranes are normal.   Neck: Carotid bruit is not present.   Cardiovascular: Normal rate, regular rhythm and normal heart sounds.    No murmur heard.  Pulmonary/Chest: Effort normal and breath sounds normal.   Abdominal: Soft. Bowel sounds are normal.   Musculoskeletal: She  exhibits no edema.        Right knee: She exhibits swelling. She exhibits normal range of motion. Tenderness found. Medial joint line and lateral joint line tenderness noted.   Neurological: She is alert and oriented to person, place, and time.   Monofilament neg     Skin: Skin is warm and dry.   Psychiatric: She has a normal mood and affect. Her behavior is normal.   Nursing note and vitals reviewed.      Assessment:      1. Uncontrolled diabetes mellitus type 2 without complications, unspecified long term insulin use status (HCC)  pioglitazone-metformin (ACTOPLUS MET) 15-500 MG per tablet    Comprehensive Metabolic Panel    Hemoglobin A1C    ramipril (ALTACE) 2.5 MG capsule   2. Hyperlipidemia, unspecified hyperlipidemia type  atorvastatin (LIPITOR) 40 MG tablet    Lipid Panel   3. Vitamin D deficiency  vitamin D (ERGOCALCIFEROL) 50000 UNITS CAPS capsule   4. Medical non-compliance     5. Morbid obesity, unspecified obesity type ([place])     6. NASH (nonalcoholic steatohepatitis)  Comprehensive Metabolic Panel           Plan:      -  Pt continues to be very non-compliant with her meds  -  Will restart all meds  -  Start ramipril  -  Weight loss a must  -  Risks/benefits discussed.  After patient consent obtained, the Right infrapatellar region of the knee was cleansed with betadine and anesthetized with ethyl chloride.   The knee was then injected with DepoMedrol 10m mixed with 2cc of Marcaine.  Ice then applied for 10 minutes  -  Recheck labs 3 mos, will call  -  Pt now living in [place] but would like to continue to follow up here.  Would like me to call her with results and recommendations  -  I did  recommend that she look into bariatric surgery near [place]  -  RTO prn for now

## 2016-04-05 NOTE — Telephone Encounter (Signed)
error 

## 2016-04-06 ENCOUNTER — Inpatient Hospital Stay: Attending: Family | Primary: Family Medicine

## 2016-04-06 ENCOUNTER — Ambulatory Visit: Admit: 2016-04-06 | Primary: Family Medicine

## 2016-04-06 ENCOUNTER — Ambulatory Visit
Admit: 2016-04-06 | Discharge: 2016-04-06 | Payer: PRIVATE HEALTH INSURANCE | Attending: Family | Primary: Family Medicine

## 2016-04-06 DIAGNOSIS — M7050 Other bursitis of knee, unspecified knee: Secondary | ICD-10-CM

## 2016-04-06 MED ORDER — IBUPROFEN 800 MG PO TABS
800 MG | ORAL_TABLET | Freq: Three times a day (TID) | ORAL | 0 refills | Status: AC | PRN
Start: 2016-04-06 — End: ?

## 2016-04-06 NOTE — Progress Notes (Signed)
Subjective:      Patient ID: Holly Bradford is a 50 y.o. female.    HPI: Acute for left knee pain    Chief Complaint   Patient presents with   ??? Leg Pain     left leg pain x3wks   ??? Knee Pain     left knee, unable to put weight on it since Monday       She has been do water aerobics regular basis.  Onset of left leg pain x 3 weeks with these works out.  Tightness sensation within muscles.  Repetative movements.  No swelling.  Onset of Monday with left knee pain and inability to put weight on it.   Pain on the inside of left knee.  Feels unstable.  NKI.      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 Body mass index is 56.59 kg/(m^2).    Review of Systems   Constitutional: Negative for chills and fever.   HENT: Negative.    Respiratory: Negative for cough and shortness of breath.    Cardiovascular: Negative for chest pain.   Gastrointestinal: Negative for abdominal pain and nausea.   Musculoskeletal: Positive for arthralgias.   Skin: Negative for rash.   Neurological: Negative for dizziness, light-headedness and headaches.   Psychiatric/Behavioral: Negative.        Objective:   Physical Exam   Constitutional: She is oriented to person, place, and time. Vital signs are normal. She appears well-developed and well-nourished. She is active. She does not have a sickly appearance. No distress.   HENT:   Right Ear: Tympanic membrane normal.   Left Ear: Tympanic membrane normal.   Nose: Nose normal.   Mouth/Throat: Oropharynx is clear and moist and mucous membranes are normal.   Cardiovascular: Normal rate, regular rhythm, S1 normal, S2 normal, normal heart sounds and normal pulses.  Exam reveals no S3.    No murmur heard.  Pulmonary/Chest: Effort normal and breath sounds normal. She has no decreased breath sounds. She has no wheezes. She has no rhonchi.   Abdominal: Soft. Bowel sounds are normal. There is no tenderness.   Musculoskeletal:        Left knee: She exhibits decreased range of motion. She  exhibits no swelling, no effusion, no bony tenderness and normal meniscus. Tenderness found. Medial joint line tenderness noted. No patellar tendon tenderness noted.        Legs:  Neurological: She is alert and oriented to person, place, and time.   Psychiatric: She has a normal mood and affect. Her behavior is normal.       Assessment:      1. Pes anserine bursitis  XR Knee Left Standard    ibuprofen (ADVIL;MOTRIN) 800 MG tablet           Plan:      Conservative IBU 800 mg TID  Discussion on overuse nature of injury  XR Knee to evaluate medal joint line  If continue and XR NEG will look at INJ  RTO if symptoms worsen or stay the same

## 2016-04-06 NOTE — Patient Instructions (Signed)
Bursitis: Care Instructions  Your Care Instructions  A bursa is a small sac of fluid that helps the tissues around a joint slide over one another easily. Injury or overuse of a joint can cause pain, redness, and inflammation in the bursa (bursitis). Bursitis usually gets better if you avoid the activity that caused it. You can help prevent bursitis from coming back by doing stretching and strengthening exercises. You may also need to change the way you do some activities.  Follow-up care is a key part of your treatment and safety. Be sure to make and go to all appointments, and call your doctor if you are having problems. It???s also a good idea to know your test results and keep a list of the medicines you take.  How can you care for yourself at home?  ?? Put ice or a cold pack on the area for 10 to 20 minutes at a time. Try to do this every 1 to 2 hours for the next 3 days (when you are awake) or until the swelling goes down. Put a thin cloth between the ice and your skin.  ?? After the 3 days of using ice, you may use heat on the area. You can use a hot water bottle; a warm, moist towel; or a heating pad set on low. You can also try alternating heat and ice.  ?? Rest the area where you have pain. Stop any activities that cause pain. Switch to activities that do not stress the area.  ?? Take pain medicines exactly as directed.  ?? If the doctor gave you a prescription medicine for pain, take it as prescribed.  ?? If you are not taking a prescription pain medicine, ask your doctor if you can take an over-the-counter medicine.  ?? Do not take two or more pain medicines at the same time unless the doctor told you to. Many pain medicines have acetaminophen, which is Tylenol. Too much acetaminophen (Tylenol) can be harmful.  ?? To prevent stiffness, gently move the joint as much as you can without pain every day. As the pain gets better, keep doing range-of-motion exercises. Ask your doctor for exercises that will make the  muscles around the joint stronger. Do these as directed.  ?? You can slowly return to the activity that caused the pain, but do it with less effort until you can do it without pain or swelling. Be sure to warm up before and stretch after you do the activity.  When should you call for help?  Call your doctor now or seek immediate medical care if:  ?? You get a fever and chills.  ?? You have increased swelling or redness in a joint.  ?? You cannot use a joint, or the pain in a joint gets worse.  Watch closely for changes in your health, and be sure to contact your doctor if:  ?? You have pain for 2 weeks or longer despite home treatment.  Where can you learn more?  Go to [email or web address] and sign in to your MyChart account. Enter Q970 in the Search Health Information box to learn more about "Bursitis: Care Instructions."     If you do not have an account, please click on the "Sign Up Now" link.  Current as of: May 11, 2015  Content Version: 11.2  ?? 2006-2017 Health[employer], [employer]. Care instructions adapted under license by [facility]. If you have questions about a medical condition or this instruction, always ask your healthcare professional. [employer]   disclaims any warranty or liability for your use of this information.

## 2016-04-06 NOTE — Progress Notes (Signed)
Visit Information    Have you changed or started any medications since your last visit including any over-the-counter medicines, vitamins, or herbal medicines? no   Are you having any side effects from any of your medications? -  no  Have you stopped taking any of your medications? Is so, why? -  no    Have you seen any other physician or provider since your last visit? No  Have you had any other diagnostic tests since your last visit? No  Have you been seen in the emergency room and/or had an admission to a hospital since we last saw you? No  Have you had your routine dental cleaning in the past 6 months? yes - 01/2016    Have you activated your MyChart account? If not, what are your barriers? Yes     Patient Care Team:  Chandra BatchMichael David Martz, DO as PCP - General (Family Medicine)    Medical History Review  Past Medical, Family, and Social History reviewed and does not contribute to the patient presenting condition    Health Maintenance   Topic Date Due   ??? HIV screen  10/18/1982   ??? Pneumococcal med risk (1 of 1 - PPSV23) 10/18/1986   ??? Diabetic retinal exam  07/19/2014   ??? Diabetic microalbuminuria test  03/27/2015   ??? Diabetic foot exam  04/02/2015   ??? Diabetic hemoglobin A1C test  10/16/2015   ??? Lipid screen  07/15/2016   ??? Flu vaccine (Season Ended) 07/19/2016   ??? Cervical cancer screen  07/30/2017   ??? DTaP/Tdap/Td vaccine (2 - Td) 04/01/2024

## 2016-11-22 NOTE — Telephone Encounter (Signed)
Telephone Outcome: Unable to reach / no voice mail.

## 2017-11-01 ENCOUNTER — Other Ambulatory Visit: Payer: Self-pay | Admitting: Family Medicine

## 2017-11-01 DIAGNOSIS — Z1231 Encounter for screening mammogram for malignant neoplasm of breast: Secondary | ICD-10-CM

## 2017-11-03 ENCOUNTER — Other Ambulatory Visit: Payer: Self-pay | Admitting: Family Medicine

## 2017-11-03 DIAGNOSIS — E010 Iodine-deficiency related diffuse (endemic) goiter: Secondary | ICD-10-CM

## 2017-11-16 ENCOUNTER — Ambulatory Visit
Admission: RE | Admit: 2017-11-16 | Discharge: 2017-11-16 | Disposition: A | Discharge status: Home / Self Care | Payer: Commercial Managed Care - PPO | Source: Ambulatory Visit | Attending: Family Medicine | Admitting: Family Medicine

## 2017-11-16 DIAGNOSIS — E010 Iodine-deficiency related diffuse (endemic) goiter: Secondary | ICD-10-CM

## 2017-11-23 ENCOUNTER — Other Ambulatory Visit: Payer: Self-pay | Admitting: Family Medicine

## 2017-11-23 DIAGNOSIS — E041 Nontoxic single thyroid nodule: Secondary | ICD-10-CM

## 2017-11-30 ENCOUNTER — Ambulatory Visit
Admission: RE | Admit: 2017-11-30 | Discharge: 2017-11-30 | Disposition: A | Discharge status: Home / Self Care | Payer: Commercial Managed Care - PPO | Source: Ambulatory Visit | Attending: Family Medicine | Admitting: Family Medicine

## 2017-11-30 DIAGNOSIS — Z1231 Encounter for screening mammogram for malignant neoplasm of breast: Secondary | ICD-10-CM

## 2017-12-05 ENCOUNTER — Other Ambulatory Visit (HOSPITAL_COMMUNITY)
Admission: RE | Admit: 2017-12-05 | Discharge: 2017-12-05 | Disposition: A | Discharge status: Home / Self Care | Payer: Commercial Managed Care - PPO | Source: Ambulatory Visit | Attending: Radiology | Admitting: Radiology

## 2017-12-05 ENCOUNTER — Ambulatory Visit
Admission: RE | Admit: 2017-12-05 | Discharge: 2017-12-05 | Disposition: A | Discharge status: Home / Self Care | Payer: Commercial Managed Care - PPO | Source: Ambulatory Visit | Attending: Family Medicine | Admitting: Family Medicine

## 2017-12-05 DIAGNOSIS — D497 Neoplasm of unspecified behavior of endocrine glands and other parts of nervous system: Secondary | ICD-10-CM | POA: Insufficient documentation

## 2017-12-05 DIAGNOSIS — E041 Nontoxic single thyroid nodule: Secondary | ICD-10-CM

## 2017-12-05 DIAGNOSIS — E042 Nontoxic multinodular goiter: Secondary | ICD-10-CM | POA: Insufficient documentation

## 2017-12-28 ENCOUNTER — Other Ambulatory Visit: Payer: Self-pay | Admitting: Otolaryngology

## 2018-01-01 ENCOUNTER — Other Ambulatory Visit: Payer: Self-pay | Admitting: Otolaryngology

## 2018-01-01 DIAGNOSIS — E041 Nontoxic single thyroid nodule: Secondary | ICD-10-CM

## 2018-01-01 DIAGNOSIS — D367 Benign neoplasm of other specified sites: Secondary | ICD-10-CM

## 2018-01-05 ENCOUNTER — Ambulatory Visit
Admission: RE | Admit: 2018-01-05 | Discharge: 2018-01-05 | Disposition: A | Discharge status: Home / Self Care | Payer: Commercial Managed Care - PPO | Source: Ambulatory Visit | Attending: Otolaryngology | Admitting: Otolaryngology

## 2018-01-05 DIAGNOSIS — D367 Benign neoplasm of other specified sites: Secondary | ICD-10-CM

## 2018-01-05 DIAGNOSIS — E041 Nontoxic single thyroid nodule: Secondary | ICD-10-CM

## 2018-01-05 MED ORDER — IOPAMIDOL (ISOVUE-300) INJECTION 61%
75.0000 mL | Freq: Once | INTRAVENOUS | Status: AC | PRN
  Administered 2018-01-05: 75 mL via INTRAVENOUS

## 2018-01-23 ENCOUNTER — Encounter (HOSPITAL_COMMUNITY): Payer: Self-pay

## 2018-01-23 ENCOUNTER — Other Ambulatory Visit: Payer: Self-pay

## 2018-01-23 ENCOUNTER — Encounter (HOSPITAL_COMMUNITY)
Admission: RE | Admit: 2018-01-23 | Discharge: 2018-01-23 | Disposition: A | Discharge status: Home / Self Care | Payer: Commercial Managed Care - PPO | Source: Ambulatory Visit | Attending: Otolaryngology | Admitting: Otolaryngology

## 2018-01-23 ENCOUNTER — Ambulatory Visit (HOSPITAL_COMMUNITY)
Admission: RE | Admit: 2018-01-23 | Discharge: 2018-01-23 | Disposition: A | Discharge status: Home / Self Care | Payer: Commercial Managed Care - PPO | Source: Ambulatory Visit | Attending: Anesthesiology | Admitting: Anesthesiology

## 2018-01-23 DIAGNOSIS — D649 Anemia, unspecified: Secondary | ICD-10-CM | POA: Diagnosis not present

## 2018-01-23 DIAGNOSIS — F329 Major depressive disorder, single episode, unspecified: Secondary | ICD-10-CM | POA: Diagnosis not present

## 2018-01-23 DIAGNOSIS — Z01818 Encounter for other preprocedural examination: Secondary | ICD-10-CM

## 2018-01-23 DIAGNOSIS — Z01810 Encounter for preprocedural cardiovascular examination: Secondary | ICD-10-CM | POA: Insufficient documentation

## 2018-01-23 DIAGNOSIS — E079 Disorder of thyroid, unspecified: Secondary | ICD-10-CM | POA: Diagnosis not present

## 2018-01-23 DIAGNOSIS — E119 Type 2 diabetes mellitus without complications: Secondary | ICD-10-CM | POA: Insufficient documentation

## 2018-01-23 DIAGNOSIS — Z01812 Encounter for preprocedural laboratory examination: Secondary | ICD-10-CM | POA: Diagnosis present

## 2018-01-23 HISTORY — DX: Anemia, unspecified: D64.9

## 2018-01-23 HISTORY — DX: Other specified postprocedural states: R11.2

## 2018-01-23 HISTORY — DX: Disorder of thyroid, unspecified: E07.9

## 2018-01-23 HISTORY — DX: Other specified postprocedural states: Z98.890

## 2018-01-23 HISTORY — DX: Other complications of anesthesia, initial encounter: T88.59XA

## 2018-01-23 HISTORY — DX: Depression, unspecified: F32.A

## 2018-01-23 HISTORY — DX: Adverse effect of unspecified anesthetic, initial encounter: T41.45XA

## 2018-01-23 HISTORY — DX: Major depressive disorder, single episode, unspecified: F32.9

## 2018-01-23 LAB — BASIC METABOLIC PANEL
ANION GAP: 15 (ref 5–15)
BUN: 7 mg/dL (ref 6–20)
CHLORIDE: 102 mmol/L (ref 101–111)
CO2: 21 mmol/L — AB (ref 22–32)
Calcium: 9.3 mg/dL (ref 8.9–10.3)
Creatinine, Ser: 0.62 mg/dL (ref 0.44–1.00)
GFR calc Af Amer: >60 mL/min (ref >60)
GLUCOSE: 197 mg/dL — AB (ref 65–99)
POTASSIUM: 4 mmol/L (ref 3.5–5.1)
Sodium: 138 mmol/L (ref 135–145)

## 2018-01-23 LAB — CBC
HEMATOCRIT: 39.9 % (ref 36.0–46.0)
HEMOGLOBIN: 12.7 g/dL (ref 12.0–15.0)
MCH: 25.8 pg — ABNORMAL LOW (ref 26.0–34.0)
MCHC: 31.8 g/dL (ref 30.0–36.0)
MCV: 80.9 fL (ref 78.0–100.0)
Platelets: 275 10*3/uL (ref 150–400)
RBC: 4.93 MIL/uL (ref 3.87–5.11)
RDW: 15.1 % (ref 11.5–15.5)
WBC: 9.2 10*3/uL (ref 4.0–10.5)

## 2018-01-23 LAB — HEMOGLOBIN A1C
Hgb A1c MFr Bld: 8.1 % — ABNORMAL HIGH (ref 4.8–5.6)
MEAN PLASMA GLUCOSE: 185.77 mg/dL

## 2018-01-23 LAB — GLUCOSE, CAPILLARY: Glucose-Capillary: 194 mg/dL — ABNORMAL HIGH (ref 65–99)

## 2018-01-23 NOTE — Pre-Procedure Instructions (Signed)
Navpreet A Rathman  01/23/2018      [facility] [id number], [place] [address] [place] [address] Phone: [phone] Fax: [phone]    Your procedure is scheduled on Friday, January 26, 2018  Report to [facility] Admitting Entrance "A" at 5:30AM   Call this number if you have problems the morning of surgery:  [phone]   Remember:  Do not eat food or drink liquids after midnight.  Take these medicines the morning of surgery with A SIP OF WATER: NONE  As of today, stop taking all Aspirins, Vitamins, Fish oils, and Herbal medications. Also stop all NSAIDS i.e. Advil, Ibuprofen, Motrin, Aleve, Anaprox, Naproxen, BC and Goody Powders. Including: Biotin and Garlic  How to Manage Your Diabetes Before and After Surgery  Why is it important to control my blood sugar before and after surgery? . Improving blood sugar levels before and after surgery helps healing and can limit problems. . A way of improving blood sugar control is eating a healthy diet by: o  Eating less sugar and carbohydrates o  Increasing activity/exercise o  Talking with your doctor about reaching your blood sugar goals . High blood sugars (greater than 180 mg/dL) can raise your risk of infections and slow your recovery, so you will need to focus on controlling your diabetes during the weeks before surgery. . Make sure that the doctor who takes care of your diabetes knows about your planned surgery including the date and location.  How do I manage my blood sugar before surgery? . Check your blood sugar at least 4 times a day, starting 2 days before surgery, to make sure that the level is not too high or low. o Check your blood sugar the morning of your surgery when you wake up and every 2 hours until you get to the Short Stay unit. . If your blood sugar is less than 70 mg/dL, you will need to treat for low blood sugar: o Do not take insulin. o Treat a low blood  sugar (less than 70 mg/dL) with  cup of clear juice (cranberry or apple), 4 glucose tablets, OR glucose gel. Recheck blood sugar in 15 minutes after treatment (to make sure it is greater than 70 mg/dL). If your blood sugar is not greater than 70 mg/dL on recheck, call [phone] o  for further instructions. . Report your blood sugar to the short stay nurse when you get to Short Stay.  . If you are admitted to the hospital after surgery: o Your blood sugar will be checked by the staff and you will probably be given insulin after surgery (instead of oral diabetes medicines) to make sure you have good blood sugar levels. o The goal for blood sugar control after surgery is 80-180 mg/dL.  WHAT DO I DO ABOUT MY DIABETES MEDICATION?  Marland Kitchen Do not take GlipiZIDE (GLUCOTROL XL) and MetFORMIN (GLUCOPHAGE-XR) the morning of surgery.  . If your CBG is greater than 220 mg/dL, call us at [phone]   Do not wear jewelry, make-up or nail polish.  Do not wear lotions, powders, perfumes, or deodorant.  Do not shave 48 hours prior to surgery.    Do not bring valuables to the hospital.  [facility] is not responsible for any belongings or valuables.  Contacts, dentures or bridgework may not be worn into surgery.  Leave your suitcase in the car.  After surgery it may be brought to your room.  For patients admitted to the hospital, discharge time will be determined by your treatment team.  Patients discharged the day of surgery will not be allowed to drive home.   Special instructions:   [facility]- Preparing For Surgery  Before surgery, you can play an important role. Because skin is not sterile, your skin needs to be as free of germs as possible. You can reduce the number of germs on your skin by washing with CHG (chlorahexidine gluconate) Soap before surgery.  CHG is an antiseptic cleaner which kills germs and bonds with the skin to continue killing germs even after washing.  Please do not use if you  have an allergy to CHG or antibacterial soaps. If your skin becomes reddened/irritated stop using the CHG.  Do not shave (including legs and underarms) for at least 48 hours prior to first CHG shower. It is OK to shave your face.  Please follow these instructions carefully.   1. Shower the NIGHT BEFORE SURGERY and the MORNING OF SURGERY with CHG.   2. If you chose to wash your hair, wash your hair first as usual with your normal shampoo.  3. After you shampoo, rinse your hair and body thoroughly to remove the shampoo.  4. Use CHG as you would any other liquid soap. You can apply CHG directly to the skin and wash gently with a scrungie or a clean washcloth.   5. Apply the CHG Soap to your body ONLY FROM THE NECK DOWN.  Do not use on open wounds or open sores. Avoid contact with your eyes, ears, mouth and genitals (private parts). Wash Face and genitals (private parts)  with your normal soap.  6. Wash thoroughly, paying special attention to the area where your surgery will be performed.  7. Thoroughly rinse your body with warm water from the neck down.  8. DO NOT shower/wash with your normal soap after using and rinsing off the CHG Soap.  9. Pat yourself dry with a CLEAN TOWEL.  10. Wear CLEAN PAJAMAS to bed the night before surgery, wear comfortable clothes the morning of surgery  11. Place CLEAN SHEETS on your bed the night of your first shower and DO NOT SLEEP WITH PETS.  Day of Surgery: Do not apply any deodorants/lotions. Please wear clean clothes to the hospital/surgery center.    Please read over the following fact sheets that you were given. Pain Booklet, Coughing and Deep Breathing and Surgical Site Infection Prevention

## 2018-01-23 NOTE — Progress Notes (Addendum)
PCP - Dr. Kennith Maes  Cardiologist - Denies  Chest x-ray - 01/23/18   EKG - 01/23/18  Stress Test - 2015- [place] remember the name for chest pain. Negative, pain was from an esophageal spasm after swallowing a frozen swedish fish.  ECHO - Denies  Cardiac Cath - Denies  Sleep Study - No CPAP - None  LABS- 01/23/18: CBC, BMP, HA1C, POCT Urine Preg-DOS  HA1C- 01/23/18 Fasting Blood Sugar - Today, 194 Checks Blood Sugar __0___ times a day Pt sts she does not have  Meter to check sts, "I'm bad about that".  Anesthesia- Yes, abnormal labs  Pt denies having chest pain, sob, or fever at this time. All instructions explained to the pt, with a verbal understanding of the material. Pt agrees to go over the instructions while at home for a better understanding. The opportunity to ask questions was provided.

## 2018-01-25 MED ORDER — DEXTROSE 5 % IV SOLN
3.0000 g | INTRAVENOUS | Status: DC
  Filled 2018-01-25: qty 3000

## 2018-01-25 NOTE — Anesthesia Preprocedure Evaluation (Addendum)
Anesthesia Evaluation  Patient identified by MRN, date of birth, ID band Patient awake    Reviewed: Allergy & Precautions  History of Anesthesia Complications (+) PONV  Airway Mallampati: II  TM Distance: >3 FB Neck ROM: Full    Dental no notable dental hx. (+) Teeth Intact, Dental Advidsory Given   Pulmonary    Pulmonary exam normal        Cardiovascular negative cardio ROS   Rhythm:Regular Rate:Normal     Neuro/Psych    GI/Hepatic negative GI ROS, Neg liver ROS,   Endo/Other  diabetes, Type 2  Renal/GU negative Renal ROS     Musculoskeletal   Abdominal   Peds  Hematology   Anesthesia Other Findings   Reproductive/Obstetrics                          Anesthesia Physical Anesthesia Plan  ASA: IV  Anesthesia Plan: General   Post-op Pain Management:    Induction: Intravenous  PONV Risk Score and Plan: 3 and Scopolamine patch - Pre-op, Treatment may vary due to age or medical condition, Midazolam, Dexamethasone and Ondansetron  Airway Management Planned: Oral ETT  Additional Equipment:   Intra-op Plan:   Post-operative Plan: Extubation in OR  Informed Consent: I have reviewed the patients History and Physical, chart, labs and discussed the procedure including the risks, benefits and alternatives for the proposed anesthesia with the patient or authorized representative who has indicated his/her understanding and acceptance.   Dental advisory given and Dental Advisory Given  Plan Discussed with: CRNA, Anesthesiologist and Surgeon  Anesthesia Plan Comments:       Anesthesia Quick Evaluation

## 2018-01-26 ENCOUNTER — Encounter (HOSPITAL_COMMUNITY): Payer: Self-pay | Admitting: *Deleted

## 2018-01-26 ENCOUNTER — Encounter (HOSPITAL_COMMUNITY)
Admission: RE | Disposition: A | Discharge status: Home / Self Care | Payer: Self-pay | Source: Ambulatory Visit | Attending: Otolaryngology

## 2018-01-26 ENCOUNTER — Ambulatory Visit (HOSPITAL_COMMUNITY)
Admission: RE | Admit: 2018-01-26 | Discharge: 2018-01-27 | Disposition: A | Discharge status: Home / Self Care | Payer: Commercial Managed Care - PPO | Source: Ambulatory Visit | Attending: Otolaryngology | Admitting: Otolaryngology

## 2018-01-26 ENCOUNTER — Ambulatory Visit (HOSPITAL_COMMUNITY): Payer: Commercial Managed Care - PPO | Admitting: Emergency Medicine

## 2018-01-26 ENCOUNTER — Other Ambulatory Visit: Payer: Self-pay

## 2018-01-26 ENCOUNTER — Ambulatory Visit (HOSPITAL_COMMUNITY): Payer: Commercial Managed Care - PPO | Admitting: Anesthesiology

## 2018-01-26 DIAGNOSIS — L988 Other specified disorders of the skin and subcutaneous tissue: Secondary | ICD-10-CM | POA: Insufficient documentation

## 2018-01-26 DIAGNOSIS — Z7984 Long term (current) use of oral hypoglycemic drugs: Secondary | ICD-10-CM | POA: Diagnosis not present

## 2018-01-26 DIAGNOSIS — E119 Type 2 diabetes mellitus without complications: Secondary | ICD-10-CM | POA: Insufficient documentation

## 2018-01-26 DIAGNOSIS — D34 Benign neoplasm of thyroid gland: Secondary | ICD-10-CM | POA: Diagnosis not present

## 2018-01-26 DIAGNOSIS — E063 Autoimmune thyroiditis: Secondary | ICD-10-CM | POA: Diagnosis not present

## 2018-01-26 DIAGNOSIS — I10 Essential (primary) hypertension: Secondary | ICD-10-CM | POA: Diagnosis not present

## 2018-01-26 DIAGNOSIS — J341 Cyst and mucocele of nose and nasal sinus: Secondary | ICD-10-CM | POA: Diagnosis not present

## 2018-01-26 DIAGNOSIS — E882 Lipomatosis, not elsewhere classified: Secondary | ICD-10-CM | POA: Insufficient documentation

## 2018-01-26 DIAGNOSIS — Z79899 Other long term (current) drug therapy: Secondary | ICD-10-CM | POA: Insufficient documentation

## 2018-01-26 DIAGNOSIS — E079 Disorder of thyroid, unspecified: Secondary | ICD-10-CM | POA: Diagnosis present

## 2018-01-26 HISTORY — DX: Type 2 diabetes mellitus without complications: E11.9

## 2018-01-26 HISTORY — DX: Bipolar disorder, unspecified: F31.9

## 2018-01-26 HISTORY — PX: CYST EXCISION: SHX5701

## 2018-01-26 HISTORY — PX: THYROIDECTOMY: SHX17

## 2018-01-26 HISTORY — DX: Migraine, unspecified, not intractable, without status migrainosus: G43.909

## 2018-01-26 HISTORY — DX: Unspecified osteoarthritis, unspecified site: M19.90

## 2018-01-26 HISTORY — DX: Pure hypercholesterolemia, unspecified: E78.00

## 2018-01-26 LAB — CBC
HCT: 35.7 % — ABNORMAL LOW (ref 36.0–46.0)
Hemoglobin: 11.3 g/dL — ABNORMAL LOW (ref 12.0–15.0)
MCH: 25.6 pg — ABNORMAL LOW (ref 26.0–34.0)
MCHC: 31.7 g/dL (ref 30.0–36.0)
MCV: 80.8 fL (ref 78.0–100.0)
Platelets: 234 10*3/uL (ref 150–400)
RBC: 4.42 MIL/uL (ref 3.87–5.11)
RDW: 15 % (ref 11.5–15.5)
WBC: 13.8 10*3/uL — ABNORMAL HIGH (ref 4.0–10.5)

## 2018-01-26 LAB — CREATININE, SERUM
Creatinine, Ser: 0.73 mg/dL (ref 0.44–1.00)
GFR calc Af Amer: >60 mL/min (ref >60)
GFR calc non Af Amer: >60 mL/min (ref >60)

## 2018-01-26 LAB — GLUCOSE, CAPILLARY
Glucose-Capillary: 184 mg/dL — ABNORMAL HIGH (ref 65–99)
Glucose-Capillary: 264 mg/dL — ABNORMAL HIGH (ref 65–99)
Glucose-Capillary: 297 mg/dL — ABNORMAL HIGH (ref 65–99)
Glucose-Capillary: 294 mg/dL — ABNORMAL HIGH (ref 65–99)
Glucose-Capillary: 296 mg/dL — ABNORMAL HIGH (ref 65–99)
Glucose-Capillary: 249 mg/dL — ABNORMAL HIGH (ref 65–99)

## 2018-01-26 LAB — POCT PREGNANCY, URINE: Preg Test, Ur: NEGATIVE

## 2018-01-26 SURGERY — CYST REMOVAL
Anesthesia: General | Site: Neck | Laterality: Right

## 2018-01-26 MED ORDER — INSULIN ASPART 100 UNIT/ML ~~LOC~~ SOLN
SUBCUTANEOUS | Status: AC
  Administered 2018-01-26: 4 [IU]
  Filled 2018-01-26: qty 1

## 2018-01-26 MED ORDER — PROPOFOL 10 MG/ML IV BOLUS
INTRAVENOUS | Status: AC
  Filled 2018-01-26: qty 20

## 2018-01-26 MED ORDER — HEMOSTATIC AGENTS (NO CHARGE) OPTIME
TOPICAL | Status: DC | PRN
  Administered 2018-01-26: 1 via TOPICAL

## 2018-01-26 MED ORDER — PROMETHAZINE HCL 25 MG/ML IJ SOLN: 6.2500 mg | INTRAMUSCULAR | Status: DC | PRN

## 2018-01-26 MED ORDER — DEXAMETHASONE SODIUM PHOSPHATE 10 MG/ML IJ SOLN
INTRAMUSCULAR | Status: DC | PRN
  Administered 2018-01-26: 10 mg via INTRAVENOUS

## 2018-01-26 MED ORDER — ENOXAPARIN SODIUM 40 MG/0.4ML ~~LOC~~ SOLN
40.0000 mg | SUBCUTANEOUS | Status: DC
  Administered 2018-01-27: 40 mg via SUBCUTANEOUS
  Filled 2018-01-26: qty 0.4

## 2018-01-26 MED ORDER — MEPERIDINE HCL 50 MG/ML IJ SOLN: 6.2500 mg | INTRAMUSCULAR | Status: DC | PRN

## 2018-01-26 MED ORDER — LIDOCAINE-EPINEPHRINE 1 %-1:100000 IJ SOLN
INTRAMUSCULAR | Status: AC
  Filled 2018-01-26: qty 1

## 2018-01-26 MED ORDER — INSULIN ASPART 100 UNIT/ML ~~LOC~~ SOLN
0.0000 [IU] | Freq: Three times a day (TID) | SUBCUTANEOUS | Status: DC
  Administered 2018-01-26: 8 [IU] via SUBCUTANEOUS
  Administered 2018-01-27: 3 [IU] via SUBCUTANEOUS

## 2018-01-26 MED ORDER — PHENYLEPHRINE HCL 10 MG/ML IJ SOLN
INTRAMUSCULAR | Status: DC | PRN
  Administered 2018-01-26: 80 ug via INTRAVENOUS

## 2018-01-26 MED ORDER — PROPOFOL 10 MG/ML IV BOLUS
INTRAVENOUS | Status: DC | PRN
  Administered 2018-01-26: 200 mg via INTRAVENOUS
  Administered 2018-01-26: 120 mg via INTRAVENOUS
  Administered 2018-01-26: 30 mg via INTRAVENOUS

## 2018-01-26 MED ORDER — ONDANSETRON HCL 4 MG/2ML IJ SOLN
4.0000 mg | INTRAMUSCULAR | Status: DC | PRN
  Administered 2018-01-26 (×2): 4 mg via INTRAVENOUS
  Filled 2018-01-26 (×3): qty 2

## 2018-01-26 MED ORDER — ACETAMINOPHEN 10 MG/ML IV SOLN: 1000.0000 mg | Freq: Once | INTRAVENOUS | Status: DC | PRN

## 2018-01-26 MED ORDER — ONDANSETRON HCL 4 MG/2ML IJ SOLN
INTRAMUSCULAR | Status: DC | PRN
  Administered 2018-01-26: 4 mg via INTRAVENOUS

## 2018-01-26 MED ORDER — DEXTROSE 5 % IV SOLN
INTRAVENOUS | Status: DC | PRN
  Administered 2018-01-26: 3 g via INTRAVENOUS

## 2018-01-26 MED ORDER — ARTIFICIAL TEARS OPHTHALMIC OINT
TOPICAL_OINTMENT | OPHTHALMIC | Status: DC | PRN
  Administered 2018-01-26: 1 via OPHTHALMIC

## 2018-01-26 MED ORDER — FENTANYL CITRATE (PF) 100 MCG/2ML IJ SOLN
INTRAMUSCULAR | Status: DC | PRN
  Administered 2018-01-26: 25 ug via INTRAVENOUS
  Administered 2018-01-26: 50 ug via INTRAVENOUS
  Administered 2018-01-26: 125 ug via INTRAVENOUS
  Administered 2018-01-26: 100 ug via INTRAVENOUS
  Administered 2018-01-26: 50 ug via INTRAVENOUS

## 2018-01-26 MED ORDER — HYDROMORPHONE HCL 1 MG/ML IJ SOLN
0.2500 mg | INTRAMUSCULAR | Status: DC | PRN
  Administered 2018-01-26 (×3): 0.5 mg via INTRAVENOUS

## 2018-01-26 MED ORDER — INSULIN ASPART 100 UNIT/ML ~~LOC~~ SOLN
4.0000 [IU] | Freq: Once | SUBCUTANEOUS | Status: AC
  Administered 2018-01-26: 4 [IU] via SUBCUTANEOUS

## 2018-01-26 MED ORDER — BACITRACIN ZINC 500 UNIT/GM EX OINT
1.0000 "application " | TOPICAL_OINTMENT | Freq: Three times a day (TID) | CUTANEOUS | Status: DC
  Administered 2018-01-26 – 2018-01-27 (×3): 1 via TOPICAL
  Filled 2018-01-26: qty 28.35

## 2018-01-26 MED ORDER — FENTANYL CITRATE (PF) 250 MCG/5ML IJ SOLN
INTRAMUSCULAR | Status: AC
  Filled 2018-01-26: qty 5

## 2018-01-26 MED ORDER — DOCUSATE SODIUM 100 MG PO CAPS
100.0000 mg | ORAL_CAPSULE | Freq: Two times a day (BID) | ORAL | Status: DC
  Administered 2018-01-26 – 2018-01-27 (×2): 100 mg via ORAL
  Filled 2018-01-26 (×3): qty 1

## 2018-01-26 MED ORDER — 0.9 % SODIUM CHLORIDE (POUR BTL) OPTIME
TOPICAL | Status: DC | PRN
  Administered 2018-01-26: 1000 mL

## 2018-01-26 MED ORDER — HYDROMORPHONE HCL 1 MG/ML IJ SOLN
INTRAMUSCULAR | Status: AC
  Filled 2018-01-26: qty 1

## 2018-01-26 MED ORDER — DEXTROSE-NACL 5-0.9 % IV SOLN
INTRAVENOUS | Status: DC
  Administered 2018-01-26: 15:00:00 via INTRAVENOUS

## 2018-01-26 MED ORDER — SUCCINYLCHOLINE CHLORIDE 20 MG/ML IJ SOLN
INTRAMUSCULAR | Status: DC | PRN
  Administered 2018-01-26: 140 mg via INTRAVENOUS
  Administered 2018-01-26: 60 mg via INTRAVENOUS

## 2018-01-26 MED ORDER — LIDOCAINE-EPINEPHRINE 1 %-1:100000 IJ SOLN
INTRAMUSCULAR | Status: DC | PRN
  Administered 2018-01-26: 17 mL

## 2018-01-26 MED ORDER — INSULIN ASPART 100 UNIT/ML ~~LOC~~ SOLN: 4.0000 [IU] | Freq: Once | SUBCUTANEOUS | Status: DC

## 2018-01-26 MED ORDER — HYDROCODONE-ACETAMINOPHEN 7.5-325 MG PO TABS: 1.0000 | ORAL_TABLET | Freq: Once | ORAL | Status: DC | PRN

## 2018-01-26 MED ORDER — PHENYLEPHRINE HCL 10 MG/ML IJ SOLN
INTRAVENOUS | Status: DC | PRN
  Administered 2018-01-26: 25 ug/min via INTRAVENOUS

## 2018-01-26 MED ORDER — HYDROCODONE-ACETAMINOPHEN 5-325 MG PO TABS
1.0000 | ORAL_TABLET | ORAL | Status: DC | PRN
  Administered 2018-01-26 – 2018-01-27 (×5): 2 via ORAL
  Filled 2018-01-26 (×5): qty 2

## 2018-01-26 MED ORDER — LACTATED RINGERS IV SOLN
INTRAVENOUS | Status: DC | PRN
  Administered 2018-01-26 (×2): via INTRAVENOUS

## 2018-01-26 MED ORDER — LIDOCAINE HCL (CARDIAC) 20 MG/ML IV SOLN
INTRAVENOUS | Status: DC | PRN
  Administered 2018-01-26: 100 mg via INTRAVENOUS

## 2018-01-26 MED ORDER — MIDAZOLAM HCL 5 MG/5ML IJ SOLN
INTRAMUSCULAR | Status: DC | PRN
  Administered 2018-01-26: 2 mg via INTRAVENOUS

## 2018-01-26 MED ORDER — MORPHINE SULFATE (PF) 4 MG/ML IV SOLN
2.0000 mg | INTRAVENOUS | Status: DC | PRN
  Administered 2018-01-26 (×3): 4 mg via INTRAVENOUS
  Filled 2018-01-26 (×3): qty 1

## 2018-01-26 MED ORDER — MAGNESIUM CITRATE PO SOLN: 1.0000 | Freq: Once | ORAL | Status: DC | PRN

## 2018-01-26 MED ORDER — INSULIN ASPART 100 UNIT/ML ~~LOC~~ SOLN
SUBCUTANEOUS | Status: AC
  Filled 2018-01-26: qty 1

## 2018-01-26 MED ORDER — BACITRACIN ZINC 500 UNIT/GM EX OINT
TOPICAL_OINTMENT | CUTANEOUS | Status: AC
  Filled 2018-01-26: qty 28.35

## 2018-01-26 MED ORDER — MIDAZOLAM HCL 2 MG/2ML IJ SOLN
INTRAMUSCULAR | Status: AC
  Filled 2018-01-26: qty 2

## 2018-01-26 MED ORDER — ONDANSETRON HCL 4 MG PO TABS: 4.0000 mg | ORAL_TABLET | ORAL | Status: DC | PRN

## 2018-01-26 MED ORDER — BACITRACIN ZINC 500 UNIT/GM EX OINT
TOPICAL_OINTMENT | CUTANEOUS | Status: DC | PRN
  Administered 2018-01-26: 1 via TOPICAL

## 2018-01-26 SURGICAL SUPPLY — 54 items
BLADE SURG 15 STRL LF DISP TIS (BLADE) IMPLANT
BLADE SURG 15 STRL SS (BLADE)
BRUSH BIPOLAR W/O CORD 20G (MISCELLANEOUS) ×3 IMPLANT
CANISTER SUCT 3000ML PPV (MISCELLANEOUS) ×3 IMPLANT
CLEANER TIP ELECTROSURG 2X2 (MISCELLANEOUS) ×3 IMPLANT
CLIP LIGATING EXTRA MED SLVR (CLIP) ×3 IMPLANT
CLIP LIGATING EXTRA SM BLUE (MISCELLANEOUS) ×3 IMPLANT
CONT SPEC 4OZ CLIKSEAL STRL BL (MISCELLANEOUS) IMPLANT
CORD BIPOLAR FORCEPS 12FT (ELECTRODE) ×3 IMPLANT
COVER SURGICAL LIGHT HANDLE (MISCELLANEOUS) ×3 IMPLANT
CRADLE DONUT ADULT HEAD (MISCELLANEOUS) IMPLANT
DERMABOND ADVANCED (GAUZE/BANDAGES/DRESSINGS) ×1
DERMABOND ADVANCED .7 DNX12 (GAUZE/BANDAGES/DRESSINGS) ×2 IMPLANT
DRAIN JACKSON RD 7FR 3/32 (WOUND CARE) IMPLANT
DRAIN SNY 10 ROU (WOUND CARE) IMPLANT
DRAIN SNY WOU 7FLT (WOUND CARE) ×3 IMPLANT
DRAPE HALF SHEET 40X57 (DRAPES) IMPLANT
ELECT COATED BLADE 2.86 ST (ELECTRODE) ×3 IMPLANT
ELECT REM PT RETURN 9FT ADLT (ELECTROSURGICAL) ×3
ELECTRODE REM PT RTRN 9FT ADLT (ELECTROSURGICAL) ×2 IMPLANT
EVACUATOR SILICONE 100CC (DRAIN) ×3 IMPLANT
FORCEPS BIPOLAR SPETZLER 8 1.0 (NEUROSURGERY SUPPLIES) ×3 IMPLANT
GAUZE SPONGE 4X4 16PLY XRAY LF (GAUZE/BANDAGES/DRESSINGS) ×3 IMPLANT
GLOVE BIO SURGEON STRL SZ 6.5 (GLOVE) ×3 IMPLANT
GLOVE BIOGEL M 6.5 STRL (GLOVE) ×3 IMPLANT
GOWN STRL REUS W/ TWL LRG LVL3 (GOWN DISPOSABLE) ×4 IMPLANT
GOWN STRL REUS W/TWL LRG LVL3 (GOWN DISPOSABLE) ×2
HEMOSTAT ARISTA ABSORB 3G PWDR (MISCELLANEOUS) ×3 IMPLANT
KIT BASIN OR (CUSTOM PROCEDURE TRAY) ×3 IMPLANT
KIT ROOM TURNOVER OR (KITS) ×3 IMPLANT
LOCATOR NERVE 3 VOLT (DISPOSABLE) ×3 IMPLANT
NEEDLE HYPO 25GX1X1/2 BEV (NEEDLE) ×3 IMPLANT
NS IRRIG 1000ML POUR BTL (IV SOLUTION) ×3 IMPLANT
PAD ARMBOARD 7.5X6 YLW CONV (MISCELLANEOUS) ×6 IMPLANT
PENCIL BUTTON HOLSTER BLD 10FT (ELECTRODE) ×3 IMPLANT
POWDER SURGICEL 3.0 GRAM (HEMOSTASIS) IMPLANT
PROBE NERVBE PRASS .33 (MISCELLANEOUS) ×3 IMPLANT
SHEARS HARMONIC 9CM CVD (BLADE) ×3 IMPLANT
SPONGE INTESTINAL PEANUT (DISPOSABLE) ×3 IMPLANT
STAPLER VISISTAT 35W (STAPLE) ×3 IMPLANT
SUT ETHILON 2 0 FS 18 (SUTURE) ×3 IMPLANT
SUT MON AB 5-0 P3 18 (SUTURE) ×3 IMPLANT
SUT PLAIN 5 0 P 3 18 (SUTURE) ×3 IMPLANT
SUT PROLENE 5 0 PS 2 (SUTURE) IMPLANT
SUT SILK 3 0 REEL (SUTURE) ×6 IMPLANT
SUT VIC AB 3-0 SH 27 (SUTURE)
SUT VIC AB 3-0 SH 27X BRD (SUTURE) IMPLANT
SUT VIC AB 3-0 SH 8-18 (SUTURE) ×3 IMPLANT
SUT VIC AB 4-0 PS2 18 (SUTURE) ×6 IMPLANT
SUT VIC AB 4-0 SH 18 (SUTURE) ×3 IMPLANT
SUT VICRYL 4-0 PS2 18IN ABS (SUTURE) IMPLANT
TOWEL NATURAL 6PK STERILE (DISPOSABLE) ×3 IMPLANT
TRAY ENT MC OR (CUSTOM PROCEDURE TRAY) ×3 IMPLANT
TUBE FEEDING 10FR FLEXIFLO (MISCELLANEOUS) IMPLANT

## 2018-01-26 NOTE — Anesthesia Postprocedure Evaluation (Signed)
Anesthesia Post Note  Patient: Kim Long  Procedure(s) Performed: EXCISION CYST REMOVAL (Right Face) LEFT THYROIDECTOMY (Left Neck)     Patient location during evaluation: PACU Anesthesia Type: General Level of consciousness: awake and alert Pain management: pain level controlled Vital Signs Assessment: post-procedure vital signs reviewed and stable Respiratory status: spontaneous breathing, nonlabored ventilation, respiratory function stable and patient connected to nasal cannula oxygen Cardiovascular status: blood pressure returned to baseline and stable Postop Assessment: no apparent nausea or vomiting Anesthetic complications: no    Last Vitals:  Vitals:   01/26/18 1300 01/26/18 1315  BP: (!) 142/87 (!) 150/86  Pulse: 90 91  Resp: 14 14  Temp:    SpO2: 96% 96%    Last Pain:  Vitals:   01/26/18 1230  TempSrc:   PainSc: 6                  Barnet Glasgow

## 2018-01-26 NOTE — Op Note (Signed)
DATE OF PROCEDURE:  01/26/2018    PRE-OPERATIVE DIAGNOSIS:  LEFT THYROID NODULE,RIGHT NASAL CYST    POST-OPERATIVE DIAGNOSIS:  Same    PROCEDURE(S):  Left thyroid lobectomy Excision of right facial mass, 1.5cm   SURGEON:  Gavin Pound, MD    ASSISTANT(S):  Jerrell Belfast, MD    ANESTHESIA:  General endotracheal anesthesia     ESTIMATED BLOOD LOSS:  30 mL   SPECIMENS:  Left thyroid nodule Left thyroid lobectomy, completion Right facial cyst   COMPLICATIONS:  None    OPERATIVE FINDINGS:  The right facial cyst was approximately 1.5cm wide and appeared to be mucous filled, no attached to the skin and not attached to intranasal mucosa. There was no septal mass. Well-circumscribed cyst.   The left thyroid lobe nodule was enlarged, but well-circumscribed, soft, and did not have any palpable calcifications. Minimal substernal extension. The nodule and lobe dissected freely off the surrounding soft tissue. The recurrent laryngeal nerve was deep to our dissection plane and was not readily identifiable, though meticulous care was taken to identify the nerve during all points of dissection. No palpable masses on the right side.     OPERATIVE DETAILS:  She was brought to the operating room and correctly identified.  General endotracheal anesthesia was induced and she was intubated. Placement of the 6.0 NIM tube was verified by the surgeon using a Glidescope.  The tube was secured. A shoulder roll was placed. The patient was prepped and draped in sterile fashion. NIM was confirmed to be in working order.   A horizontal midline neck incision was made in the lower neck between the two sternocleidomastoid muscles. The incision was carried down through subcutaneous tissues and platysma.  Subplatysmal flaps were elevated superiorly to the level of thyroid notch and inferiorly to sternal notch. Next, the midline raphe of the strap muscles was identified and divided.  The underlying thyroid isthmus was seen.   The strap muscles were elevated laterally off the left lobe using blunt finger dissection.  The gland was retracted medially and dissection then proceeded superiorly towards the superior pedicle. This was carefully dissected circumferentially and then ligated and divided using the Harmonic device.  Dissection was then carried inferiorly, where the middle thyroid vein was noted.  This was divided and the gland was retracted medially.  We then carefully dissected near the inferior pole, where we were not able identify the recurrent laryngeal nerve, as we were superficial to this plane of dissection. A candidate for the superior parathyroid glands was seen and dissected free and left in the neck.  The inferior thyroid vascular pedicle was then ligated and divided.  Dissection along the distal course of the recurrent laryngeal nerve was performed using a McCabe dissector, peeling the gland off the surface of surrounding soft tissue. Care was taken to use the NIM stimulator device multiple times to ensure no nerve stimulation.  Next, the thyroid gland was released from its tracheal attachments by dividing Berry's ligament.   Copious irrigation of the neck was performed and hemostasis was achieved with bipolar where necessary. A #7 flat drain was brought through the skin and placed into the thyroid compartment. Interrupted 3-0 Vicryls were used to reapproximate the strap muscles in midline and to close platysma.  The dermis was closed with 4-0 Vicryls and a 5-0 running subcuticular Monocryl was used for the skiny. Dermabond was applied.   Next, the right facial cyst was addressed. 1% lidocaine with 1:100,000 epinephrine was injected into the right  nasolabial fold. A 2.5cm incision was made with a 15 blade. Next, soft tissue was dissected with a hemostat. The cyst, which was well-circumscribed, was identified and dissected from surrounding soft tissue bluntly. The cyst was  punctured with grasping and mucinous debris was suctioned away. The cyst was sharply dissected off the surrounding soft tissue and passed off the field. The wound was copiously irrigated with normal saline. 3-0 Vicryls were used to close the deep layer, 4-0 buried interrupted to close the dermis. Next, 5-0 plain gut in a running, locking fashion were used to close the skin. The patient's skin was cleansed and bacitracin was applied.   The patient was awoken from anesthesia, extubated, and transported to PACU in good condition without complications.

## 2018-01-26 NOTE — Progress Notes (Signed)
Pts blood sugars remaining up, spoke with Dr Valma Cava and Dr Luisa Dago who will order a sliding scale for pt

## 2018-01-26 NOTE — H&P (Signed)
The surgical history remains accurate and without interval change. The condition still exists which makes the procedure necessary. The patient and/or family is aware of their condition and has been informed of the risks and benefits of surgery, as well as alternatives. All parties have elected to proceed with surgery.   Surgical plan: left possible total thyroidectomy, excision right facial cyst  Otolaryngology New Patient Note  Subjective: Kim Long is a 52 y.o. female kindly referred by Via, Harless Litten, MD for evaluation of left thyroid nodule. This was first noticed in November. She feels that it is slightly increasing in size. It is not very painful but does cause an intermittent twinge of pain. She has not had thyroid nodules before November of this year to her knowledge. She reports that there is no family history of thyroid cancer. However, her mother does have thyroid nodules and a possible prior diagnosis of papillary thyroid cancer. The patient has not had any prior radiation exposure. She can eat whatever she would like. She does feel some compression on her esophagus sometimes when swallowing most recently. She feels that the nodule has become increasingly large in size in the last few weeks. She does feel pressure on her trachea when she lies flat at night though there is no actual shortness of breath. There are no other lumps or bumps in her neck. There have been no voice changes. She has undergone ultrasound as well as ultrasound-guided FNA of this lesion.  In addition, the patient points out a right nasal cyst which appears to be approximately 2 x 1 cm that is palpable below the skin along the inferior margin of the right nasal bone. There are no associated dental caries. There is no palpable association or pit to the skin. This is not palpable within the nose itself. She reports that she had a mole removed on her nose once but did not feel this is related to the cyst. The  cyst is under the skin and has caused an infection to drain out her skin in her nose in the past. This was years ago. She estimates this is has been present for approximately 15 years and tends to decrease and increase in size. Generally when is at its normal size which is about how it is now, this is not painful.  Non-smoker. Presents today with her husband.  Past Medical History:  Diagnosis Date  . Diabetes mellitus ([place])  . Hypertension   Past Surgical History:  Procedure Laterality Date  . CESAREAN SECTION  . CHOLECYSTECTOMY  . dnc  . LEG SURGERY   Family History  Problem Relation Age of Onset  . Hypertension Mother  . Diabetes Mother  . Cancer Father  . Hypertension Father  . Diabetes Son   Social History   Social History  . Marital status: Married  Spouse name: N/A  . Number of children: N/A  . Years of education: N/A   Occupational History  . Not on file.   Social History Main Topics  . Smoking status: Never Smoker  . Smokeless tobacco: Never Used  . Alcohol use Not on file  . Drug use: Unknown  . Sexual activity: Not on file   Other Topics Concern  . Not on file   Social History Narrative  . No narrative on file   Not on File Updated Medication List:   cyanocobalamin (VITAMIN B-12) 1000 MCG tablet  Sig - Route: Take 1,000 mcg by mouth daily. - Oral  Class: Historical  Med  garlic 5,465 mg Cap  Sig - Route: Take by mouth. - Oral  Class: Historical Med  glipiZIDE XL (GLUCOTROL XL) 10 MG 24 hr tablet  Sig: TAKE ONE TABLET BY MOUTH WITH FIRST MEAL OF THE DAY ONCE DAILY  Class: Historical Med  metFORMIN ER (GLUCOPHAGE-XR) 500 MG extended release tablet  Sig: TAKE 1 TABLET BY MOUTH EVERY DAY IN THE EVENING WITH DINNER  Class: Historical Med  omega-3/dha/epa/fish oil (OMEGA-3 FATTY ACIDS-FISH OIL) 300-1,000 mg capsule  Sig - Route: Take 1 g by mouth daily. - Oral  Class: Historical Med  pyridoxine, vitamin B6, (VITAMIN B-6) 100 MG tablet  Sig -  Route: Take 200 mg by mouth daily. - Oral  Class: Historical Med  sulfamethoxazole-trimethoprim (BACTRIM DS) 800-160 mg per tablet  Sig: TAKE 1 TABLET BY MOUTH TWICE A DAY WITH FOOD  Class: Historical Med    ROS A complete review of systems was conducted and was negative except as stated in the HPI.   Objective: Vitals:   Vitals:   01/26/18 0606  BP: (!) 166/79  Pulse: 97  Resp: (!) 97  Temp: 98.2 F (36.8 C)  SpO2: 100%     Physical Exam:  General Normocephalic, Awake, Alert and appropriate for the exam Obese  Eyes PERRL, no scleral icterus or conjunctival hemorrhage.  EOMI.  Ears Right ear- EAC patent, no obstructing cerumen. TM: intact, no effusion, no retraction, normal landmarks Left ear- EAC patent, no obstructing cerumen. TM: intact, no effusion, no retraction, normal landmarks  Nose Patent, No polyps or masses seen. There is congestion along the right nasal valve though I cannot palpate the nasal cavity here. The cyst appears to be extranasal and is at the superior margin of the right nasolabial fold. It appears to be approximately 2 x 1 cm. It is soft and feels pretty well-circumscribed. It is unclear to me whether this tracks or could be attached to the tear duct or to the underlying maxillary sinus. He does not appear to be attached to the skin. There are no concerning skin lesions on her face.  Oral Pharynx No mucosal lesions or tumors seen. Dentition is grossly normal.  Mirror laryngeal examination reveals crisp epiglottis, large base of tongue. Cannot visualize arytenoids.  Lymphatics No cervical lymphadenopathy or masses on palpation  Endocrine No thyroidmegaly, no thyroid masses palpated  Cardio-vascular No cyanosis, regular rate  Pulmonary No audible stridor, Breathing easily with no labor. No dysphonia.  Neuro Symmetric facial movement.  Tongue protrudes in midline.  Psychiatry Appropriate affect and mood for clinic visit.  Skin No scars or lesions on  face or neck.    Assessment/Plan:  My impression is that Kim Long has  1. Thyroid nodule  2. Nasal dermoid cyst   We will plan for a left thyroid lobectomy, possible total as well as excision of her right nasofacial cyst. We discussed the risks and benefits of surgery including scar, hematoma, bleeding, infection, damage to one or both recurrent laryngeal nerves, temporary or permanent hypoparathyroidism, need for lifelong potential calcium supplementation or thyroid hormone supplementation, aspiration, voice changes, cyst recurrence, scar on the face. She expressed understanding and wishes to proceed with surgery.

## 2018-01-26 NOTE — Transfer of Care (Signed)
Immediate Anesthesia Transfer of Care Note  Patient: Kim Long  Procedure(s) Performed: EXCISION CYST REMOVAL (Right Face) LEFT THYROIDECTOMY (Left Neck)  Patient Location: PACU  Anesthesia Type:General  Level of Consciousness: awake, alert  and oriented  Airway & Oxygen Therapy: Patient Spontanous Breathing and Patient connected to face mask oxygen  Post-op Assessment: Report given to RN, Post -op Vital signs reviewed and stable and Patient moving all extremities X 4  Post vital signs: Reviewed and stable  Last Vitals:  Vitals:   01/26/18 0606  BP: (!) 166/79  Pulse: 97  Resp: (!) 97  Temp: 36.8 C  SpO2: 100%    Last Pain:  Vitals:   01/26/18 0606  TempSrc: Oral      Patients Stated Pain Goal: 3 (52/08/02 2336)  Complications: No apparent anesthesia complications

## 2018-01-26 NOTE — Anesthesia Procedure Notes (Signed)
Procedure Name: Intubation Date/Time: 01/26/2018 7:50 AM Performed by: Neldon Newport, CRNA Pre-anesthesia Checklist: Timeout performed, Patient being monitored, Suction available, Emergency Drugs available and Patient identified Patient Re-evaluated:Patient Re-evaluated prior to induction Oxygen Delivery Method: Circle system utilized Preoxygenation: Pre-oxygenation with 100% oxygen Induction Type: IV induction Ventilation: Mask ventilation without difficulty and Oral airway inserted - appropriate to patient size Laryngoscope Size: Mac, 3 and Glidescope Tube type: Oral (NIMS tube used) Tube size: 6.0 mm Number of attempts: 2 Placement Confirmation: breath sounds checked- equal and bilateral,  positive ETCO2 and ETT inserted through vocal cords under direct vision Secured at: 22 cm Tube secured with: Tape Dental Injury: Bloody posterior oropharynx  Comments: Dr. Blenda Nicely requested we use Glide Scope for intubation for her to see that NIMS tube placement accurate.  Using Glide Scope, the NIMS tube tip was too far past tip of rigid stylette.  DL using MAC 3 was easy intubation per Dr. Valma Cava.  Placement verified using Glide Scope.

## 2018-01-26 NOTE — Progress Notes (Signed)
   ENT Progress Note: s/p Procedure(s): EXCISION CYST REMOVAL LEFT THYROIDECTOMY   Subjective: C/O nausea  Objective: Vital signs in last 24 hours: Temp:  [97 F (36.1 C)-98.7 F (37.1 C)] 98.7 F (37.1 C) (02/08 1440) Pulse Rate:  [88-97] 89 (02/08 1440) Resp:  [14-97] 16 (02/08 1440) BP: (142-166)/(76-87) 151/76 (02/08 1440) SpO2:  [92 %-100 %] 92 % (02/08 1440) Weight:  [133.4 kg (294 lb)] 133.4 kg (294 lb) (02/08 1440) Weight change:  Last BM Date: 01/25/18  Intake/Output from previous day: No intake/output data recorded. Intake/Output this shift: Total I/O In: 1213.8 [I.V.:1163.8; IV Piggyback:50] Out: 200 [Blood:200]  Labs: Recent Labs    01/26/18 1514  WBC 13.8*  HGB 11.3*  HCT 35.7*  PLT 234   No results for input(s): NA, K, CL, CO2, GLUCOSE, BUN, CALCIUM in the last 72 hours.  Invalid input(s): CREATININR  Studies/Results: No results found.   PHYSICAL EXAM: Inc intact Nl JP outpt Nl voice and airway   Assessment/Plan: Stable postop Monitor o/n JP and po    Nylan Nevel 01/26/2018, 5:56 PM

## 2018-01-27 ENCOUNTER — Encounter (HOSPITAL_COMMUNITY): Payer: Self-pay | Admitting: Otolaryngology

## 2018-01-27 DIAGNOSIS — D34 Benign neoplasm of thyroid gland: Secondary | ICD-10-CM | POA: Diagnosis not present

## 2018-01-27 LAB — GLUCOSE, CAPILLARY: Glucose-Capillary: 182 mg/dL — ABNORMAL HIGH (ref 65–99)

## 2018-01-27 MED ORDER — HYDROCODONE-ACETAMINOPHEN 5-325 MG PO TABS
1.0000 | ORAL_TABLET | Freq: Four times a day (QID) | ORAL | 0 refills | Status: AC | PRN
Start: 2018-01-27 — End: ?

## 2018-01-27 MED ORDER — CEPHALEXIN 500 MG PO CAPS: 500.0000 mg | ORAL_CAPSULE | Freq: Three times a day (TID) | ORAL | 1 refills | Status: AC

## 2018-01-27 NOTE — Discharge Summary (Signed)
Physician Discharge Summary  Patient ID: Kim Long MRN: [id number] DOB/AGE: August 24, 1966 52 y.o.  Admit date: 01/26/2018 Discharge date: 01/27/2018  Admission Diagnoses:  Active Problems:   Thyroid mass Facial cyst  Discharge Diagnoses:  Same  Surgeries: Procedure(s): EXCISION CYST REMOVAL LEFT THYROIDECTOMY on 01/26/2018   Consultants: None  Discharged Condition: Improved  Hospital Course: Kim Long is an 52 y.o. female who was admitted 01/26/2018 with a diagnosis of Active Problems:   Thyroid mass  and went to the operating room on 01/26/2018 and underwent the above named procedures.   Pt stable, d/c to home on POD#1.  Recent vital signs:  Vitals:   01/27/18 0032 01/27/18 0455  BP: (!) 104/54 (!) 96/50  Pulse: 81 81  Resp: 16 16  Temp: 98.4 F (36.9 C) 98.3 F (36.8 C)  SpO2: 95% 97%    Recent laboratory studies:  Results for orders placed or performed during the hospital encounter of 01/26/18  Glucose, capillary  Result Value Ref Range   Glucose-Capillary 184 (H) 65 - 99 mg/dL  Glucose, capillary  Result Value Ref Range   Glucose-Capillary 264 (H) 65 - 99 mg/dL   Comment 1 Notify RN   Glucose, capillary  Result Value Ref Range   Glucose-Capillary 297 (H) 65 - 99 mg/dL   Comment 1 Notify RN    Comment 2 Document in Chart   Glucose, capillary  Result Value Ref Range   Glucose-Capillary 294 (H) 65 - 99 mg/dL   Comment 1 Notify RN    Comment 2 Document in Chart   CBC  Result Value Ref Range   WBC 13.8 (H) 4.0 - 10.5 K/uL   RBC 4.42 3.87 - 5.11 MIL/uL   Hemoglobin 11.3 (L) 12.0 - 15.0 g/dL   HCT 35.7 (L) 36.0 - 46.0 %   MCV 80.8 78.0 - 100.0 fL   MCH 25.6 (L) 26.0 - 34.0 pg   MCHC 31.7 30.0 - 36.0 g/dL   RDW 15.0 11.5 - 15.5 %   Platelets 234 150 - 400 K/uL  Creatinine, serum  Result Value Ref Range   Creatinine, Ser 0.73 0.44 - 1.00 mg/dL   GFR calc non Af Amer >60 >60 mL/min   GFR calc Af Amer >60 >60 mL/min  Glucose, capillary   Result Value Ref Range   Glucose-Capillary 296 (H) 65 - 99 mg/dL  Glucose, capillary  Result Value Ref Range   Glucose-Capillary 249 (H) 65 - 99 mg/dL  Glucose, capillary  Result Value Ref Range   Glucose-Capillary 182 (H) 65 - 99 mg/dL  Pregnancy, urine POC  Result Value Ref Range   Preg Test, Ur NEGATIVE NEGATIVE    Discharge Medications:     Diagnostic Studies: Dg Chest 2 View  Result Date: 01/23/2018 CLINICAL DATA:  Preoperative evaluation for upcoming partial thyroidectomy EXAM: CHEST  2 VIEW COMPARISON:  None. FINDINGS: The heart size and mediastinal contours are within normal limits. Both lungs are clear. The visualized skeletal structures are unremarkable. IMPRESSION: No active cardiopulmonary disease. Electronically Signed   By: Inez Catalina M.D.   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 Creatinine was obtained on site at [place] at [address] Results: Creatinine 0.6 mg/dL. EXAM: CT NECK WITH CONTRAST; CT MAXILLOFACIAL WITH CONTRAST TECHNIQUE: Multidetector CT imaging of the face and neck was performed with intravenous contrast. Multiplanar CT image reconstructions  were also generated. CONTRAST:  58mL ISOVUE-300 IOPAMIDOL (ISOVUE-300) INJECTION 61% COMPARISON:  Thyroid ultrasound and ultrasound-guided FNA 12/05/2017. FINDINGS: CT FACE: Osseous: Mandible intact. Maxilla, zygoma, and nasal bones intact. Skull base and visible calvarium intact. No acute osseous abnormality identified. Orbits: Bilateral preseptal orbits soft tissues appears symmetric and normal. Globes are intact. Bilateral intraorbital soft tissues appears symmetric and normal. Bilateral orbital walls are intact. Sinuses: The nasal septum is intact with rightward deviation anteriorly. Symmetric appearing nasal cavity mucosal thickening (series 400, image 34). There is a subtle en-plaque  type soft tissue mass along the right anterior septum situated between the septum and inferior turbinate. See series 3, image 37 and series 500, image 26, measuring about 9 mm diameter. No other nasal or nasal cavity mass is identified. Bilateral paranasal sinuses are clear. The frontal sinuses are hypoplastic. Bilateral tympanic cavities and mastoids are clear. Soft tissues: Superficial face and scalp soft tissues appear normal. CT NECK Pharynx and larynx: Laryngeal and pharyngeal soft tissue contours are normal. Negative parapharyngeal and retropharyngeal spaces. Salivary glands: The sublingual space, bilateral submandibular glands, and bilateral parotid glands are within normal limits. Thyroid: Quantum mottle artifact at the thoracic inlet probably related to large body habitus. Moderate to severe enlargement of soft tissue at the left thyroid lobe level, with a rounded and masslike component along the central and lower aspect of the left thyroid lobe (series 3, image 93). The left lobe encompasses 58 x 50 x 68 millimeters (AP by transverse by CC) foreign estimated left lobe volume of 99 milliliters. Associated regional mass effect including rightward mass effect on the trachea and esophagus at the thoracic inlet, but no airway narrowing. No infiltration of the surrounding soft tissue planes. The right lobe has a more normal size and configuration. The isthmus is difficult to separate from the abnormally enlarged left thyroid lobe. Lymph nodes: No cervical lymphadenopathy. Bilateral cervical lymph nodes are within normal limits. Vascular: Major vascular structures in the neck and at the skull base are patent. Limited intracranial: Negative. Skeleton: Lower cervical spine disc, endplate, and facet degeneration. No acute osseous abnormality identified. Upper chest: There is only slight retrosternal extension of the enlarged left lower pole thyroid tissue. The prevascular superior mediastinal space appears spared.  There is quantum mottle artifact throughout the upper chest. No definite superior mediastinal lymphadenopathy. Mediastinal lipomatosis is evident. Negative visualized lung parenchyma. IMPRESSION: 1. Rounded, masslike enlargement of the left thyroid lobe, especially at the lower pole. Estimated left lobe volume of 99 mL. Regional mass effect including rightward shift of the trachea and esophagus at the thoracic inlet, but no airway narrowing. No lymphadenopathy or surrounding soft tissue invasion. 2. Otherwise negative neck soft tissues. 3. Subtle 9 mm plaque-like soft tissue mass suspected along the right aspect of the septum abutting the right inferior turbinate (series 500, image 26). No other nasal region mass. Intact septum with rightward deviation. 4. Otherwise negative CT appearance of the face. Electronically Signed   By: Genevie Ann M.D.   On: 01/05/2018 13:28   Ct Maxillofacial W Contrast  Result Date: 01/05/2018 CLINICAL DATA:  52 year old female with thyroid nodule, nasal dermoid cyst. Creatinine was obtained on site at [place] at [address] Results: Creatinine 0.6 mg/dL. EXAM: CT NECK WITH CONTRAST; CT MAXILLOFACIAL WITH CONTRAST TECHNIQUE: Multidetector CT imaging of the face and neck was performed with intravenous contrast. Multiplanar CT image reconstructions were also generated. CONTRAST:  79mL ISOVUE-300 IOPAMIDOL (ISOVUE-300) INJECTION 61% COMPARISON:  Thyroid ultrasound and  ultrasound-guided FNA 12/05/2017. FINDINGS: CT FACE: Osseous: Mandible intact. Maxilla, zygoma, and nasal bones intact. Skull base and visible calvarium intact. No acute osseous abnormality identified. Orbits: Bilateral preseptal orbits soft tissues appears symmetric and normal. Globes are intact. Bilateral intraorbital soft tissues appears symmetric and normal. Bilateral orbital walls are intact. Sinuses: The nasal septum is intact with rightward deviation anteriorly. Symmetric appearing nasal cavity  mucosal thickening (series 400, image 34). There is a subtle en-plaque type soft tissue mass along the right anterior septum situated between the septum and inferior turbinate. See series 3, image 37 and series 500, image 26, measuring about 9 mm diameter. No other nasal or nasal cavity mass is identified. Bilateral paranasal sinuses are clear. The frontal sinuses are hypoplastic. Bilateral tympanic cavities and mastoids are clear. Soft tissues: Superficial face and scalp soft tissues appear normal. CT NECK Pharynx and larynx: Laryngeal and pharyngeal soft tissue contours are normal. Negative parapharyngeal and retropharyngeal spaces. Salivary glands: The sublingual space, bilateral submandibular glands, and bilateral parotid glands are within normal limits. Thyroid: Quantum mottle artifact at the thoracic inlet probably related to large body habitus. Moderate to severe enlargement of soft tissue at the left thyroid lobe level, with a rounded and masslike component along the central and lower aspect of the left thyroid lobe (series 3, image 93). The left lobe encompasses 58 x 50 x 68 millimeters (AP by transverse by CC) foreign estimated left lobe volume of 99 milliliters. Associated regional mass effect including rightward mass effect on the trachea and esophagus at the thoracic inlet, but no airway narrowing. No infiltration of the surrounding soft tissue planes. The right lobe has a more normal size and configuration. The isthmus is difficult to separate from the abnormally enlarged left thyroid lobe. Lymph nodes: No cervical lymphadenopathy. Bilateral cervical lymph nodes are within normal limits. Vascular: Major vascular structures in the neck and at the skull base are patent. Limited intracranial: Negative. Skeleton: Lower cervical spine disc, endplate, and facet degeneration. No acute osseous abnormality identified. Upper chest: There is only slight retrosternal extension of the enlarged left lower pole  thyroid tissue. The prevascular superior mediastinal space appears spared. There is quantum mottle artifact throughout the upper chest. No definite superior mediastinal lymphadenopathy. Mediastinal lipomatosis is evident. Negative visualized lung parenchyma. IMPRESSION: 1. Rounded, masslike enlargement of the left thyroid lobe, especially at the lower pole. Estimated left lobe volume of 99 mL. Regional mass effect including rightward shift of the trachea and esophagus at the thoracic inlet, but no airway narrowing. No lymphadenopathy or surrounding soft tissue invasion. 2. Otherwise negative neck soft tissues. 3. Subtle 9 mm plaque-like soft tissue mass suspected along the right aspect of the septum abutting the right inferior turbinate (series 500, image 26). No other nasal region mass. Intact septum with rightward deviation. 4. Otherwise negative CT appearance of the face. Electronically Signed   By: Genevie Ann M.D.   On: 01/05/2018 13:28    Disposition: Final discharge disposition not confirmed  Discharge Instructions    Diet - low sodium heart healthy   Complete by:  As directed    Discharge instructions   Complete by:  As directed    Neck Incision: 1. Limited activity 2. Liquid and soft diet, advance as tolerated 3. May bathe and shower day after surgery 4. Wound care - Gentle cleaning with soap and water 5. DO NOT APPLY ANY OINTMENT 6. Elevate Head of Bed  Facial Incision: 1. Limited activity 2. Liquid and soft diet, advance  as tolerated 3. May bathe and shower, keep incision dry for 3 days postop 4. Wound care - 1/2 str H2O2 and bacitracin ointment twice daily 5. Elevate Head of Bed 6. Ice compress for 24 hrs if needed   Increase activity slowly   Complete by:  As directed          Signed: Vincen Bejar 01/27/2018, 8:27 AM

## 2018-01-27 NOTE — Progress Notes (Signed)
   ENT Progress Note: POD #1 s/p Procedure(s): EXCISION CYST REMOVAL LEFT THYROIDECTOMY   Subjective: tol po, airway stable  Objective: Vital signs in last 24 hours: Temp:  [97 F (36.1 C)-98.7 F (37.1 C)] 98.3 F (36.8 C) (02/09 0455) Pulse Rate:  [81-93] 81 (02/09 0455) Resp:  [14-16] 16 (02/09 0455) BP: (96-153)/(45-87) 96/50 (02/09 0455) SpO2:  [92 %-100 %] 97 % (02/09 0455) Weight:  [133.4 kg (294 lb)] 133.4 kg (294 lb) (02/08 1440) Weight change: 0 kg (0 lb) Last BM Date: 01/25/18  Intake/Output from previous day: 02/08 0701 - 02/09 0700 In: 2333.8 [I.V.:2283.8; IV Piggyback:50] Out: 760 [Urine:520; Drains:40; Blood:200] Intake/Output this shift: No intake/output data recorded.  Labs: Recent Labs    01/26/18 1514  WBC 13.8*  HGB 11.3*  HCT 35.7*  PLT 234   No results for input(s): NA, K, CL, CO2, GLUCOSE, BUN, CALCIUM in the last 72 hours.  Invalid input(s): CREATININR  Studies/Results: No results found.   PHYSICAL EXAM: Inc intact, no swelling JP removed   Assessment/Plan: Pt stable  D/C to home    Winnie Umali 01/27/2018, 8:21 AM

## 2018-11-19 IMAGING — US US THYROID BIOPSY
1 series · 13 of 13 positions shown · non-contrast
Comparison: Thyroid ultrasound performed 11/16/2017

MEDICATIONS:
None

COMPLICATIONS:
None immediate.

INDICATION: Indeterminate left mid thyroid nodule

EXAM:
ULTRASOUND GUIDED FINE NEEDLE ASPIRATION OF INDETERMINATE LEFT MID
THYROID NODULE
TECHNIQUE: Informed written consent was obtained from the patient after a
discussion of the risks, benefits and alternatives to treatment.
Questions regarding the procedure were encouraged and answered. A
timeout was performed prior to the initiation of the procedure.

[Series 1: us thyroid biopsy · 0.09mm/px · 13 acquisitions, 13 frames shown]
[im 1/13]
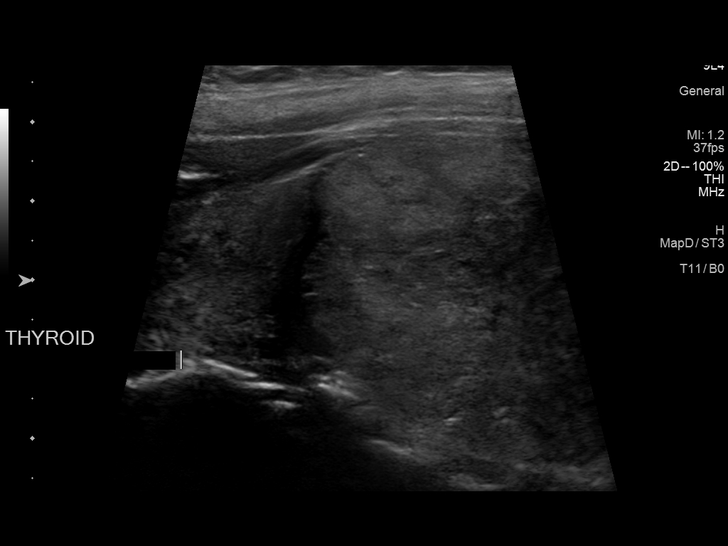
[im 2/13]
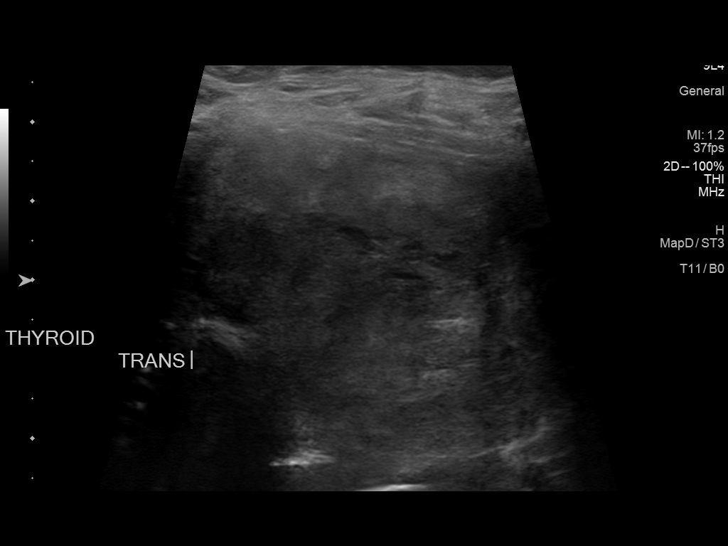
[im 3/13]
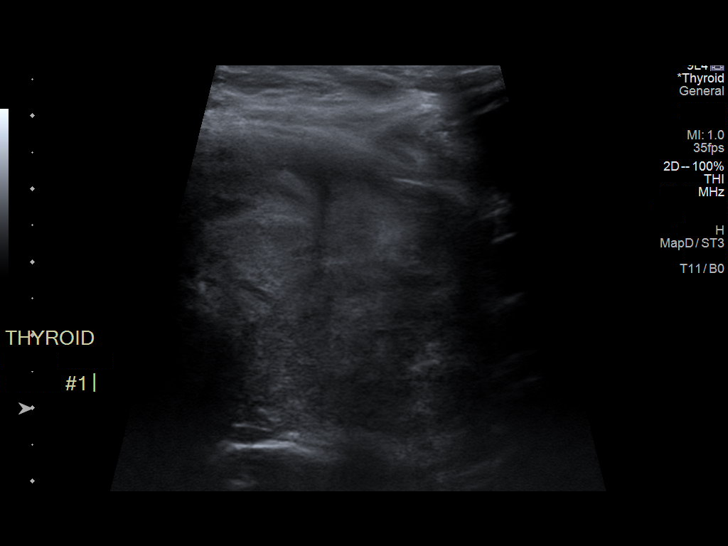
[im 4/13]
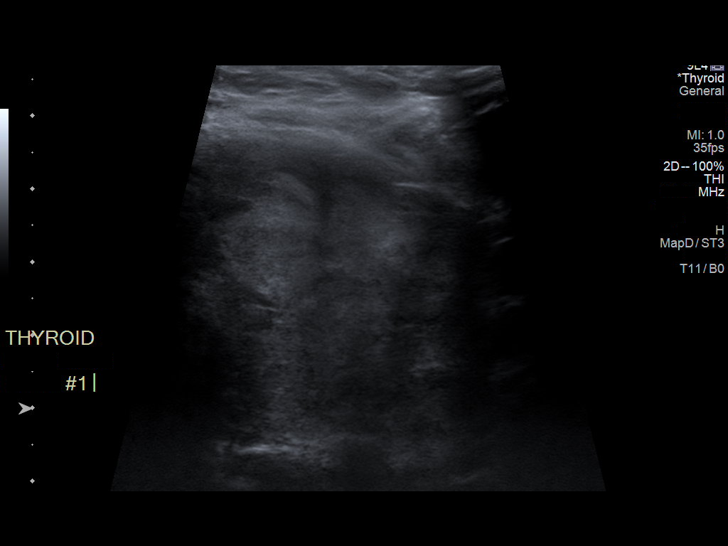
[im 5/13]
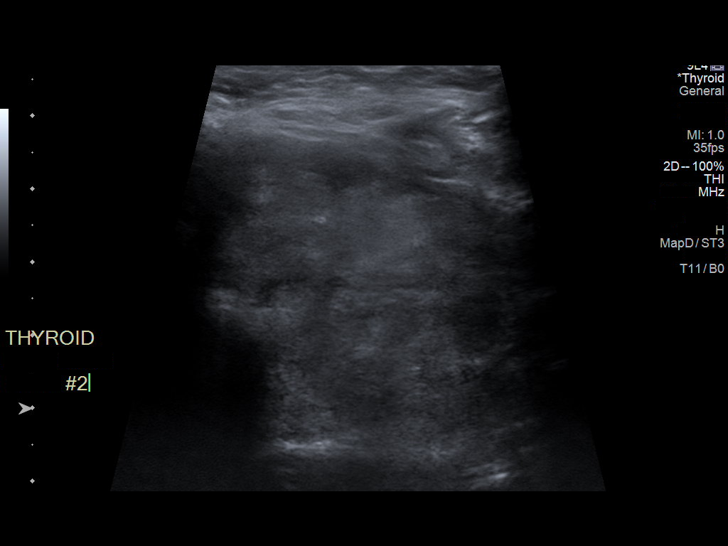
[im 6/13]
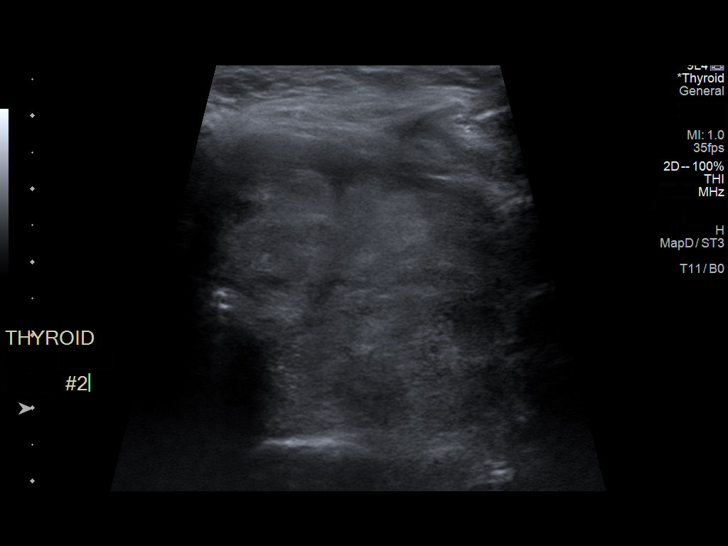
[im 7/13]
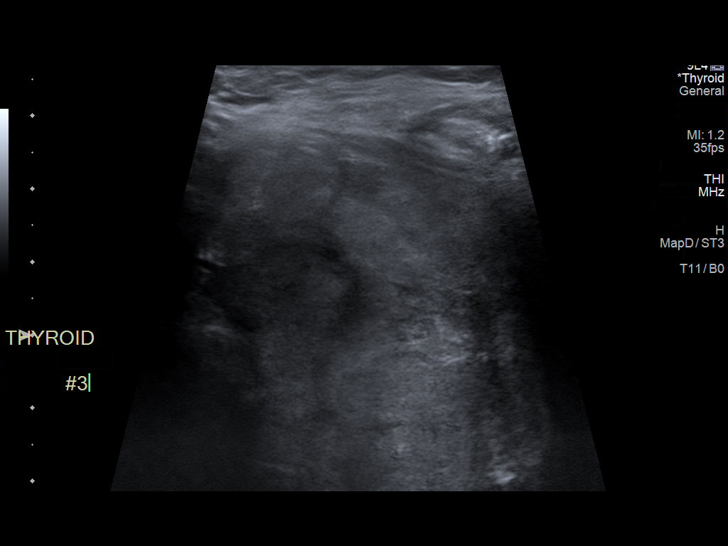
[im 8/13]
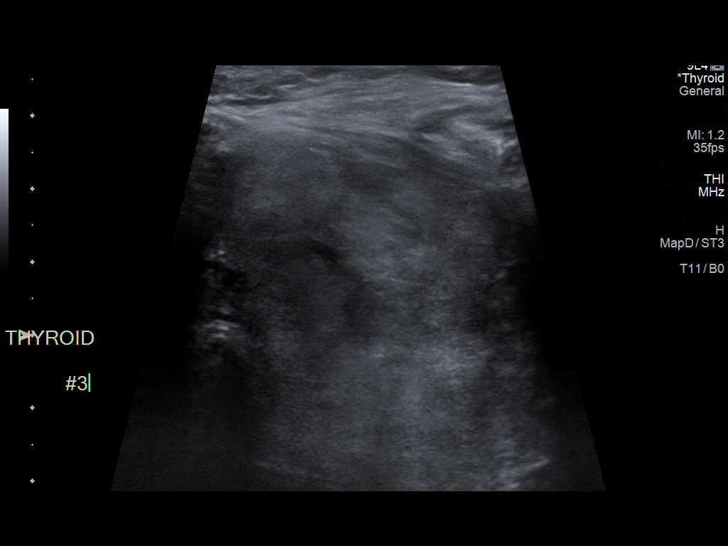
[im 9/13]
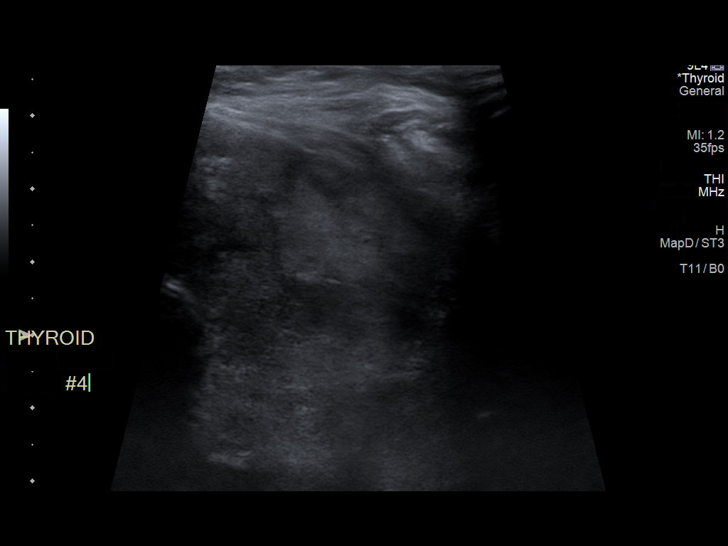
[im 10/13]
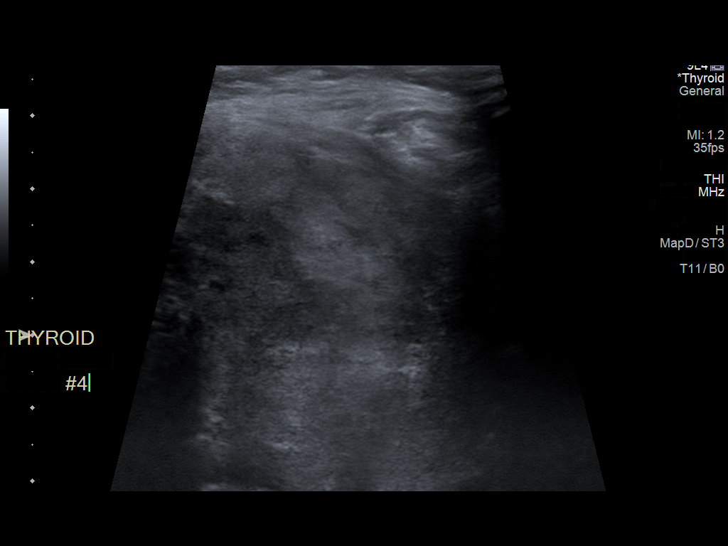
[im 11/13]
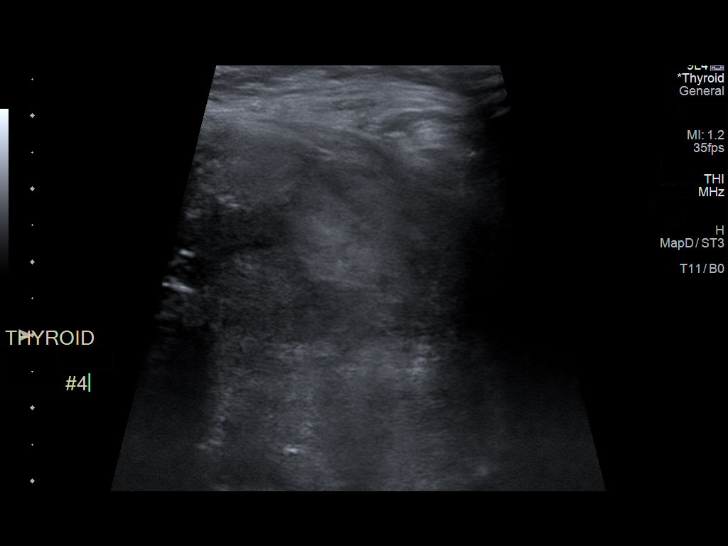
[im 12/13]
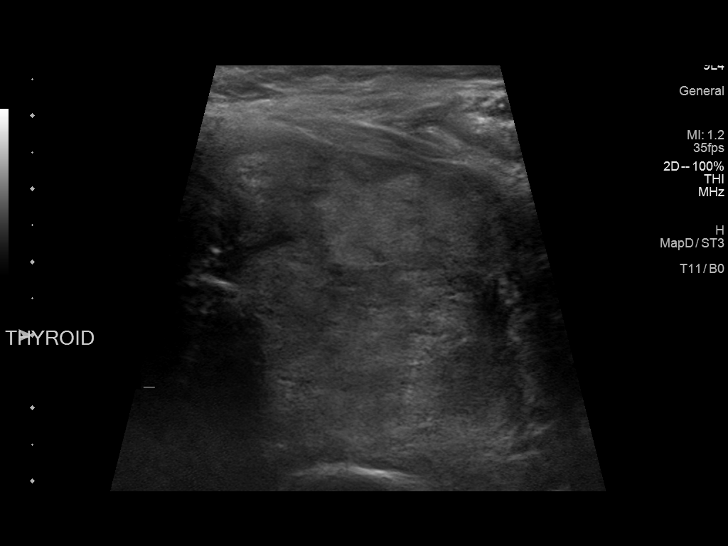
[im 13/13]
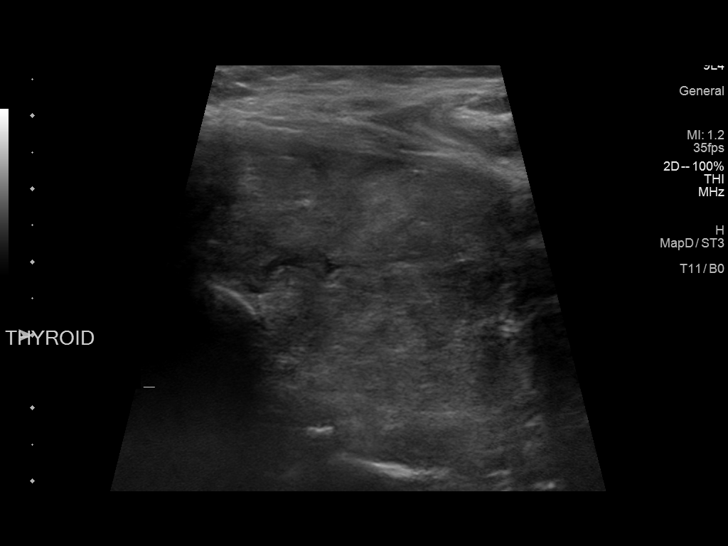

[13 of 13 positions shown; findings below may reference images not displayed]

Pre-procedural ultrasound scanning demonstrated unchanged size and
appearance of the indeterminate nodule within the left mid thyroid.

The procedure was planned. The neck was prepped in the usual sterile
fashion, and a sterile drape was applied covering the operative
field. A timeout was performed prior to the initiation of the
procedure. Local anesthesia was provided with 1% lidocaine.

Under direct ultrasound guidance, 4 FNA biopsies were performed of
the left mid thyroid nodule with a 25 gauge needle. Multiple
ultrasound images were saved for procedural documentation purposes.
The samples were prepared and submitted to pathology.

Limited post procedural scanning was negative for hematoma or
additional complication. Dressings were placed. The patient
tolerated the above procedures procedure well without immediate
postprocedural complication.
FINDINGS: FINDINGS
Nodule reference number based on prior diagnostic ultrasound: 2

Maximum size:  5.4 cm

Location: Left  ;  Mid

ACR TI-RADS total points: 3

ACR TI-RADS risk category:  TR3 (3 points)

Prior biopsy:  No

Reason for biopsy: meets ACR TI-RADS criteria

Ultrasound imaging confirms appropriate placement of the needles
within the thyroid nodule.
IMPRESSION: Technically successful ultrasound guided fine needle aspiration of
left mid thyroid nodule as described above.

## 2018-12-20 IMAGING — CT CT MAXILLOFACIAL W/ CM
6 of 12 series · 16 of 47 positions shown, 17 images · IV contrast (75CC ISOVUE 300)
Comparison: Thyroid ultrasound and ultrasound-guided FNA
12/05/2017.

CLINICAL DATA: 51-year-old female with thyroid nodule, nasal
dermoid cyst.

Creatinine was obtained on site at [HOSPITAL] at [REDACTED].
Results: Creatinine 0.6 mg/dL.
EXAM:
CT NECK WITH CONTRAST;
CT MAXILLOFACIAL WITH CONTRAST
TECHNIQUE: Multidetector CT imaging of the face and neck was performed with
intravenous contrast. Multiplanar CT image reconstructions were also
generated.
CONTRAST:  75mL M8FXIM-9VV IOPAMIDOL (M8FXIM-9VV) INJECTION 61%

[Series 2: max bone · axial · 0.53mm/px · z∈[+37,+197]mm · 3 of 130 slices shown, 4 images]
[im 33/130  brain]
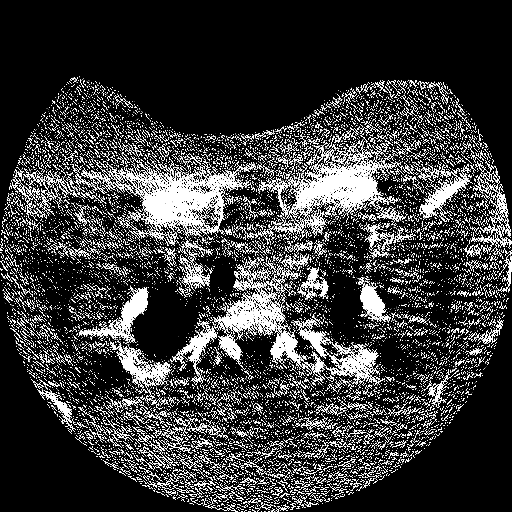
[im 33/130  bone]
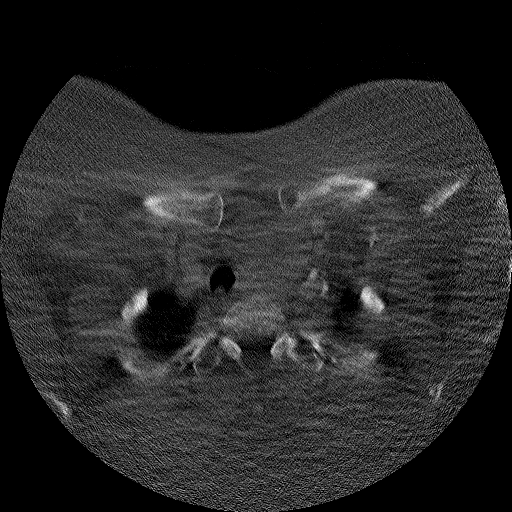
[im 65/130  bone]
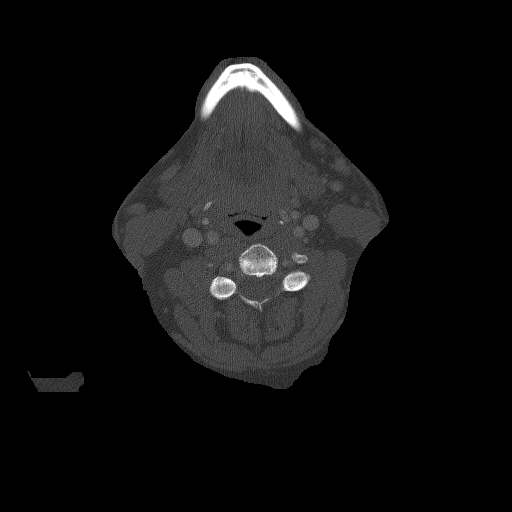
[im 97/130  bone]
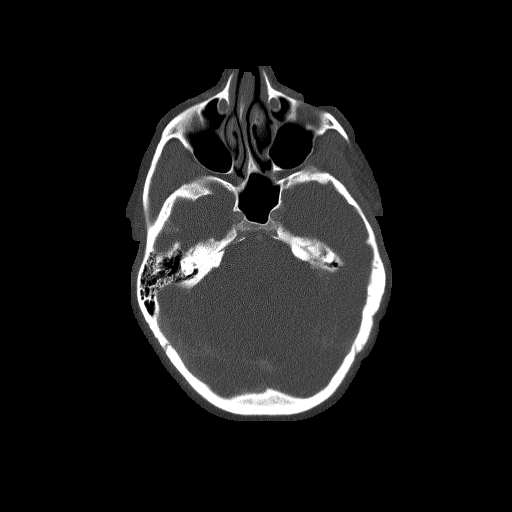

[Series 502: cor · coronal · 0.53mm/px · 3 of 131 slices shown]
[im 33/131  bone]
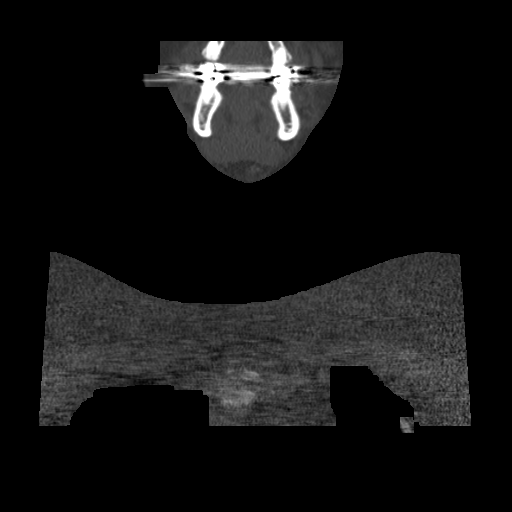
[im 66/131  bone]
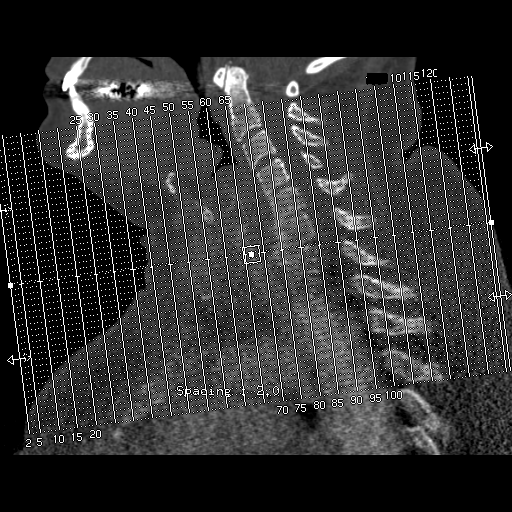
[im 98/131  bone]
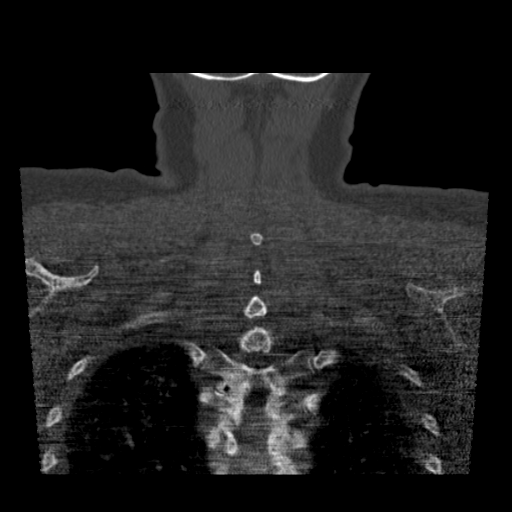

[Series 503: angled axial · axial · 0.53mm/px · z∈[-48,+79]mm · 3 of 135 slices shown]
[im 34/135  bone]
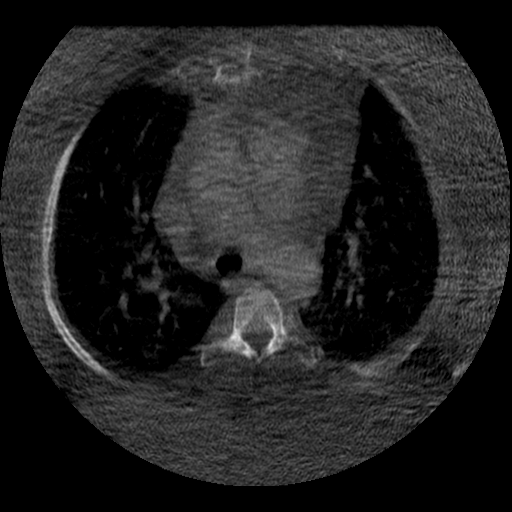
[im 68/135  bone]
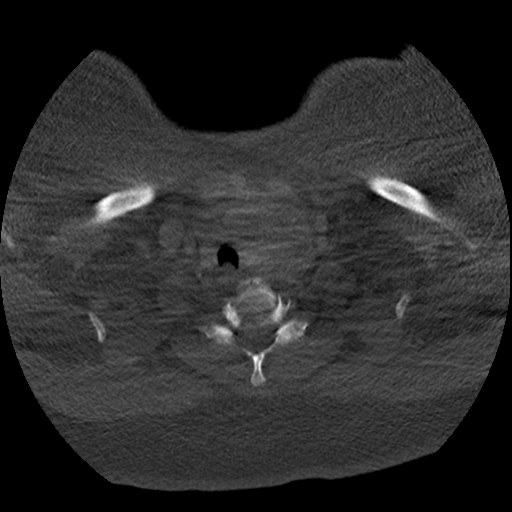
[im 101/135  bone]
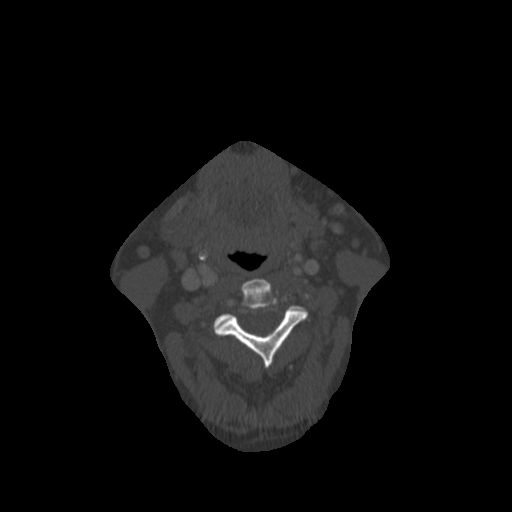

[Series 504: sag · sagittal · 0.53mm/px · 1 of 133 slices shown]
[im 67/133  bone]
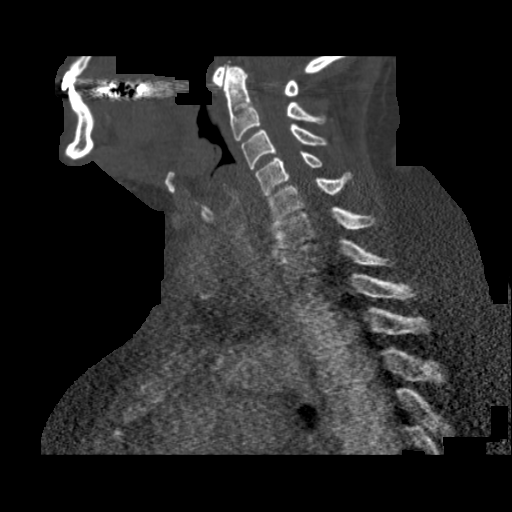

[Series 604: cor st facial · coronal · 0.63mm/px · 3 of 137 slices shown]
[im 35/137  bone]
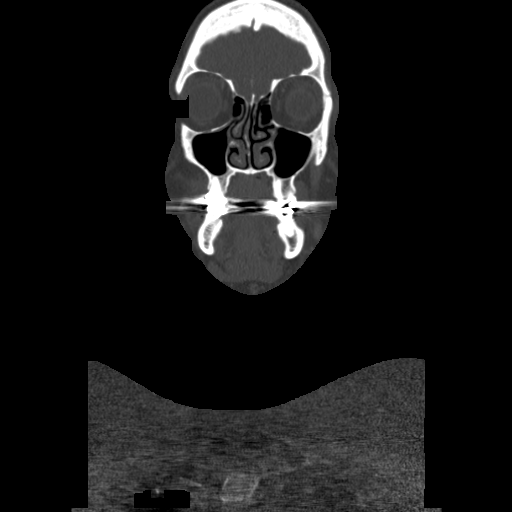
[im 69/137  bone]
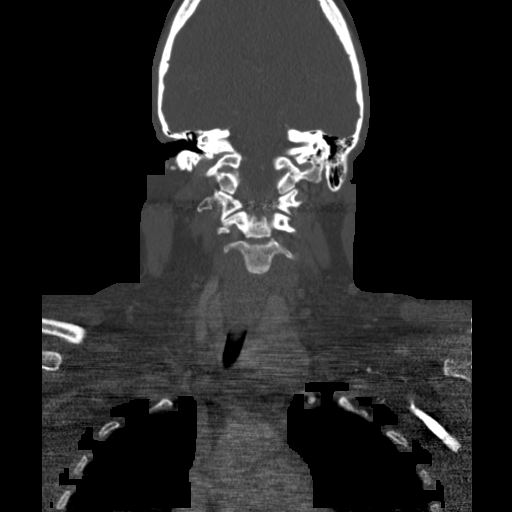
[im 103/137  bone]
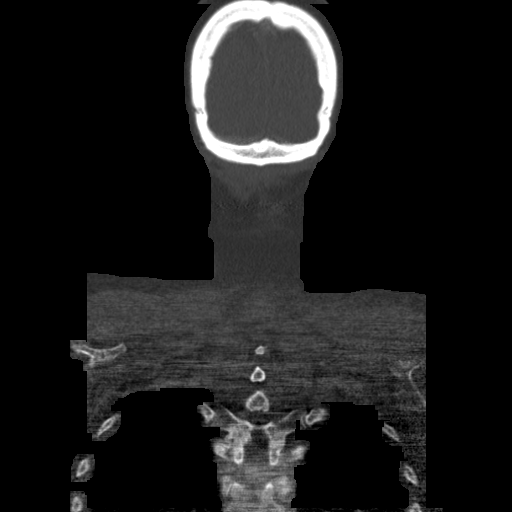

[Series 605: sag st facial · sagittal · 0.63mm/px · 3 of 137 slices shown]
[im 35/137  bone]
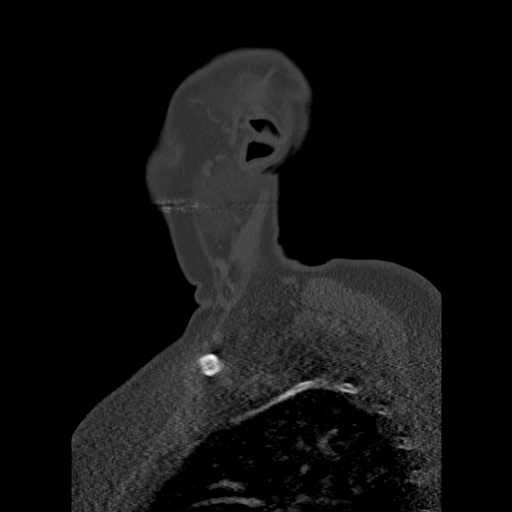
[im 69/137  bone]
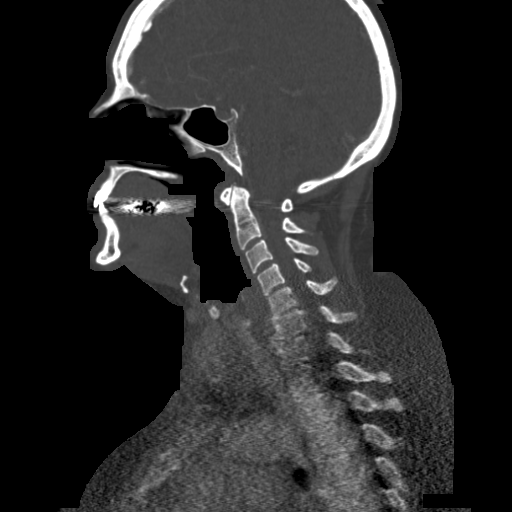
[im 103/137  bone]
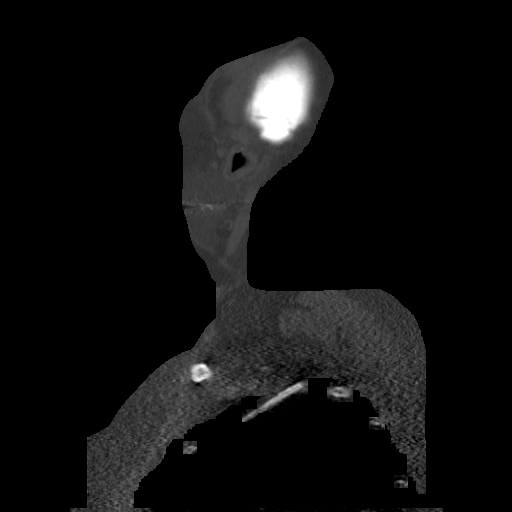

[16 of 47 positions shown; findings below may reference images not displayed]

FINDINGS: CT FACE:

Osseous: Mandible intact. Maxilla, zygoma, and nasal bones intact.
Skull base and visible calvarium intact. No acute osseous
abnormality identified.

Orbits: Bilateral preseptal orbits soft tissues appears symmetric
and normal. Globes are intact. Bilateral intraorbital soft tissues
appears symmetric and normal. Bilateral orbital walls are intact.

Sinuses:

The nasal septum is intact with rightward deviation anteriorly.
Symmetric appearing nasal cavity mucosal thickening (series 400,
image 34).

There is a subtle en-plaque type soft tissue mass along the right
anterior septum situated between the septum and inferior turbinate.
See series 3, image 37 and series 500, image 26, measuring about 9
mm diameter.

No other nasal or nasal cavity mass is identified.

Bilateral paranasal sinuses are clear. The frontal sinuses are
hypoplastic.

Bilateral tympanic cavities and mastoids are clear.

Soft tissues: Superficial face and scalp soft tissues appear normal.

CT NECK

Pharynx and larynx: Laryngeal and pharyngeal soft tissue contours
are normal. Negative parapharyngeal and retropharyngeal spaces.

Salivary glands: The sublingual space, bilateral submandibular
glands, and bilateral parotid glands are within normal limits.

Thyroid: Quantum mottle artifact at the thoracic inlet probably
related to large body habitus.

Moderate to severe enlargement of soft tissue at the left thyroid
lobe level, with a rounded and masslike component along the central
and lower aspect of the left thyroid lobe (series 3, image 93). The
left lobe encompasses 58 x 50 x 68 millimeters (AP by transverse by
CC) foreign estimated left lobe volume of 99 milliliters. Associated
regional mass effect including rightward mass effect on the trachea
and esophagus at the thoracic inlet, but no airway narrowing. No
infiltration of the surrounding soft tissue planes.

The right lobe has a more normal size and configuration. The isthmus
is difficult to separate from the abnormally enlarged left thyroid
lobe.

Lymph nodes: No cervical lymphadenopathy. Bilateral cervical lymph
nodes are within normal limits.

Vascular: Major vascular structures in the neck and at the skull
base are patent.

Limited intracranial: Negative.

Skeleton: Lower cervical spine disc, endplate, and facet
degeneration. No acute osseous abnormality identified.

Upper chest: There is only slight retrosternal extension of the
enlarged left lower pole thyroid tissue. The prevascular superior
mediastinal space appears spared. There is quantum mottle artifact
throughout the upper chest. No definite superior mediastinal
lymphadenopathy. Mediastinal lipomatosis is evident. Negative
visualized lung parenchyma.
IMPRESSION: 1. Rounded, masslike enlargement of the left thyroid lobe,
especially at the lower pole. Estimated left lobe volume of 99 mL.
Regional mass effect including rightward shift of the trachea and
esophagus at the thoracic inlet, but no airway narrowing. No
lymphadenopathy or surrounding soft tissue invasion.
2. Otherwise negative neck soft tissues.
3. Subtle 9 mm plaque-like soft tissue mass suspected along the
right aspect of the septum abutting the right inferior turbinate
(series 500, image 26). No other nasal region mass. Intact septum
with rightward deviation.
4. Otherwise negative CT appearance of the face.
